# Patient Record
Sex: Male | Born: 1965 | Race: White | Hispanic: No | Marital: Single | Smoking: Current every day smoker
Health system: Southern US, Community
[De-identification: ages and names within clinical notes are randomized; demographics above are authoritative.]

## PROBLEM LIST (undated history)

## (undated) DIAGNOSIS — B1920 Unspecified viral hepatitis C without hepatic coma: Secondary | ICD-10-CM

## (undated) DIAGNOSIS — E119 Type 2 diabetes mellitus without complications: Secondary | ICD-10-CM

## (undated) DIAGNOSIS — I10 Essential (primary) hypertension: Secondary | ICD-10-CM

## (undated) DIAGNOSIS — E785 Hyperlipidemia, unspecified: Secondary | ICD-10-CM

## (undated) DIAGNOSIS — J449 Chronic obstructive pulmonary disease, unspecified: Secondary | ICD-10-CM

## (undated) HISTORY — DX: Hyperlipidemia, unspecified: E78.5

## (undated) HISTORY — PX: INGUINAL HERNIA REPAIR: SHX194

## (undated) HISTORY — DX: Unspecified viral hepatitis C without hepatic coma: B19.20

## (undated) HISTORY — DX: Chronic obstructive pulmonary disease, unspecified: J44.9

## (undated) HISTORY — DX: Essential (primary) hypertension: I10

## (undated) HISTORY — DX: Type 2 diabetes mellitus without complications: E11.9

---

## 2005-08-19 HISTORY — PX: OTHER SURGICAL HISTORY: SHX169

## 2009-07-18 LAB — HM DIABETES EYE EXAM: HM Diabetic Eye Exam: NORMAL

## 2009-07-20 ENCOUNTER — Encounter: Payer: Self-pay | Admitting: Internal Medicine

## 2009-07-20 ENCOUNTER — Ambulatory Visit: Payer: Self-pay | Admitting: Internal Medicine

## 2009-07-20 DIAGNOSIS — J4489 Other specified chronic obstructive pulmonary disease: Secondary | ICD-10-CM | POA: Insufficient documentation

## 2009-07-20 DIAGNOSIS — E785 Hyperlipidemia, unspecified: Secondary | ICD-10-CM | POA: Insufficient documentation

## 2009-07-20 DIAGNOSIS — E1165 Type 2 diabetes mellitus with hyperglycemia: Secondary | ICD-10-CM

## 2009-07-20 DIAGNOSIS — E1129 Type 2 diabetes mellitus with other diabetic kidney complication: Secondary | ICD-10-CM

## 2009-07-20 DIAGNOSIS — I10 Essential (primary) hypertension: Secondary | ICD-10-CM | POA: Insufficient documentation

## 2009-07-20 DIAGNOSIS — IMO0002 Reserved for concepts with insufficient information to code with codable children: Secondary | ICD-10-CM | POA: Insufficient documentation

## 2009-07-20 DIAGNOSIS — J449 Chronic obstructive pulmonary disease, unspecified: Secondary | ICD-10-CM

## 2009-07-20 DIAGNOSIS — G894 Chronic pain syndrome: Secondary | ICD-10-CM

## 2009-07-20 LAB — CONVERTED CEMR LAB
AST: 35 units/L (ref 0–37)
Albumin: 3.4 g/dL — ABNORMAL LOW (ref 3.5–5.2)
BUN: 23 mg/dL (ref 6–23)
Basophils Absolute: 0 10*3/uL (ref 0.0–0.1)
Bilirubin Urine: NEGATIVE
Bilirubin, Direct: 0.1 mg/dL (ref 0.0–0.3)
Calcium: 10 mg/dL (ref 8.4–10.5)
Chloride: 97 meq/L (ref 96–112)
Eosinophils Absolute: 0.1 10*3/uL (ref 0.0–0.7)
HCT: 46 % (ref 39.0–52.0)
Hgb A1c MFr Bld: 10.6 % — ABNORMAL HIGH (ref 4.6–6.5)
Ketones, ur: NEGATIVE mg/dL
Lymphocytes Relative: 27.9 % (ref 12.0–46.0)
Lymphs Abs: 1.9 10*3/uL (ref 0.7–4.0)
MCV: 94.8 fL (ref 78.0–100.0)
Monocytes Absolute: 0.8 10*3/uL (ref 0.1–1.0)
Neutro Abs: 4 10*3/uL (ref 1.4–7.7)
Nitrite: POSITIVE
Potassium: 4.7 meq/L (ref 3.5–5.1)
RBC: 4.85 M/uL (ref 4.22–5.81)
RDW: 12.5 % (ref 11.5–14.6)
Total Bilirubin: 0.9 mg/dL (ref 0.3–1.2)
Urine Glucose: >=1000 mg/dL
Urobilinogen, UA: 0.2 (ref 0.0–1.0)
pH: 5.5 (ref 5.0–8.0)

## 2009-08-02 ENCOUNTER — Telehealth: Payer: Self-pay | Admitting: Internal Medicine

## 2009-08-10 ENCOUNTER — Ambulatory Visit: Payer: Self-pay | Admitting: Internal Medicine

## 2009-08-10 DIAGNOSIS — F17200 Nicotine dependence, unspecified, uncomplicated: Secondary | ICD-10-CM

## 2009-08-28 ENCOUNTER — Telehealth: Payer: Self-pay | Admitting: Internal Medicine

## 2009-09-12 ENCOUNTER — Telehealth: Payer: Self-pay | Admitting: Internal Medicine

## 2009-10-10 ENCOUNTER — Telehealth: Payer: Self-pay | Admitting: Internal Medicine

## 2009-10-11 ENCOUNTER — Ambulatory Visit: Payer: Self-pay | Admitting: Internal Medicine

## 2009-10-11 LAB — CONVERTED CEMR LAB
ALT: 53 units/L (ref 0–53)
Alkaline Phosphatase: 81 units/L (ref 39–117)
BUN: 29 mg/dL — ABNORMAL HIGH (ref 6–23)
CO2: 26 meq/L (ref 19–32)
Chloride: 102 meq/L (ref 96–112)
GFR calc non Af Amer: 53.57 mL/min (ref >60)
Glucose, Bld: 254 mg/dL — ABNORMAL HIGH (ref 70–99)
HDL goal, serum: 40 mg/dL
Hgb A1c MFr Bld: 8.2 % — ABNORMAL HIGH (ref 4.6–6.5)
Ketones, ur: NEGATIVE mg/dL
Leukocytes, UA: NEGATIVE
Nitrite: NEGATIVE
Potassium: 4.6 meq/L (ref 3.5–5.1)
Sodium: 136 meq/L (ref 135–145)
Specific Gravity, Urine: >=1.03 (ref 1.000–1.030)
Total Bilirubin: 0.5 mg/dL (ref 0.3–1.2)
Total CK: 42 units/L (ref 7–232)
pH: 5.5 (ref 5.0–8.0)

## 2009-10-12 ENCOUNTER — Encounter: Payer: Self-pay | Admitting: Internal Medicine

## 2009-11-10 ENCOUNTER — Telehealth: Payer: Self-pay | Admitting: Internal Medicine

## 2009-11-20 ENCOUNTER — Telehealth: Payer: Self-pay | Admitting: Internal Medicine

## 2009-11-24 ENCOUNTER — Telehealth: Payer: Self-pay | Admitting: Internal Medicine

## 2009-11-27 ENCOUNTER — Telehealth: Payer: Self-pay | Admitting: Internal Medicine

## 2009-12-11 ENCOUNTER — Ambulatory Visit: Payer: Self-pay | Admitting: Internal Medicine

## 2009-12-11 LAB — CONVERTED CEMR LAB
BUN: 21 mg/dL (ref 6–23)
Basophils Absolute: 0 10*3/uL (ref 0.0–0.1)
Basophils Relative: 0.3 % (ref 0.0–3.0)
Chloride: 101 meq/L (ref 96–112)
Creatinine, Ser: 1.3 mg/dL (ref 0.4–1.5)
Eosinophils Absolute: 0 10*3/uL (ref 0.0–0.7)
Eosinophils Relative: 0.5 % (ref 0.0–5.0)
Hemoglobin: 13.9 g/dL (ref 13.0–17.0)
MCHC: 35.5 g/dL (ref 30.0–36.0)
MCV: 93.5 fL (ref 78.0–100.0)
Monocytes Absolute: 1 10*3/uL (ref 0.1–1.0)
Monocytes Relative: 13.1 % — ABNORMAL HIGH (ref 3.0–12.0)
Neutro Abs: 4.2 10*3/uL (ref 1.4–7.7)
Nitrite: POSITIVE
RBC: 4.18 M/uL — ABNORMAL LOW (ref 4.22–5.81)
Urobilinogen, UA: 1 (ref 0.0–1.0)
WBC: 7.3 10*3/uL (ref 4.5–10.5)
pH: 6 (ref 5.0–8.0)

## 2009-12-26 ENCOUNTER — Ambulatory Visit: Payer: Self-pay | Admitting: Internal Medicine

## 2010-01-08 ENCOUNTER — Telehealth: Payer: Self-pay | Admitting: Internal Medicine

## 2010-02-14 ENCOUNTER — Telehealth: Payer: Self-pay | Admitting: Internal Medicine

## 2010-03-16 ENCOUNTER — Ambulatory Visit: Payer: Self-pay | Admitting: Internal Medicine

## 2010-03-16 LAB — CONVERTED CEMR LAB
Alkaline Phosphatase: 92 units/L (ref 39–117)
Basophils Relative: 0.6 % (ref 0.0–3.0)
Calcium: 10 mg/dL (ref 8.4–10.5)
Eosinophils Absolute: 0.1 10*3/uL (ref 0.0–0.7)
Eosinophils Relative: 1.5 % (ref 0.0–5.0)
GFR calc non Af Amer: 57.43 mL/min (ref >60)
Hemoglobin: 15.6 g/dL (ref 13.0–17.0)
Lymphs Abs: 2.8 10*3/uL (ref 0.7–4.0)
Monocytes Absolute: 0.8 10*3/uL (ref 0.1–1.0)
Neutro Abs: 4.6 10*3/uL (ref 1.4–7.7)
Neutrophils Relative %: 54.9 % (ref 43.0–77.0)
Nitrite: NEGATIVE
Platelets: 232 10*3/uL (ref 150.0–400.0)
RBC: 4.72 M/uL (ref 4.22–5.81)
RDW: 13.5 % (ref 11.5–14.6)
Sodium: 134 meq/L — ABNORMAL LOW (ref 135–145)
Specific Gravity, Urine: >=1.03 (ref 1.000–1.030)
Total Bilirubin: 0.6 mg/dL (ref 0.3–1.2)
Total Protein: 6.9 g/dL (ref 6.0–8.3)
Urine Glucose: >=1000 mg/dL
WBC: 8.4 10*3/uL (ref 4.5–10.5)
pH: 5 (ref 5.0–8.0)

## 2010-03-17 ENCOUNTER — Encounter: Payer: Self-pay | Admitting: Internal Medicine

## 2010-03-20 ENCOUNTER — Telehealth: Payer: Self-pay | Admitting: Internal Medicine

## 2010-03-30 ENCOUNTER — Telehealth: Payer: Self-pay | Admitting: Internal Medicine

## 2010-04-06 ENCOUNTER — Telehealth: Payer: Self-pay | Admitting: Internal Medicine

## 2010-04-09 ENCOUNTER — Ambulatory Visit: Payer: Self-pay | Admitting: Internal Medicine

## 2010-04-09 DIAGNOSIS — M542 Cervicalgia: Secondary | ICD-10-CM

## 2010-04-19 ENCOUNTER — Telehealth: Payer: Self-pay | Admitting: Internal Medicine

## 2010-04-24 ENCOUNTER — Telehealth: Payer: Self-pay | Admitting: Internal Medicine

## 2010-04-27 ENCOUNTER — Telehealth: Payer: Self-pay | Admitting: Internal Medicine

## 2010-08-01 ENCOUNTER — Telehealth: Payer: Self-pay | Admitting: Internal Medicine

## 2010-08-06 ENCOUNTER — Telehealth: Payer: Self-pay | Admitting: Internal Medicine

## 2010-08-08 ENCOUNTER — Ambulatory Visit: Payer: Self-pay | Admitting: Internal Medicine

## 2010-08-08 ENCOUNTER — Encounter: Payer: Self-pay | Admitting: Internal Medicine

## 2010-08-08 DIAGNOSIS — R740 Nonspecific elevation of levels of transaminase and lactic acid dehydrogenase [LDH]: Secondary | ICD-10-CM

## 2010-08-08 LAB — CONVERTED CEMR LAB
ALT: 51 units/L (ref 0–53)
BUN: 28 mg/dL — ABNORMAL HIGH (ref 6–23)
Bilirubin Urine: NEGATIVE
CO2: 23 meq/L (ref 19–32)
Chloride: 101 meq/L (ref 96–112)
Creatinine, Ser: 1.5 mg/dL (ref 0.4–1.5)
Eosinophils Absolute: 0.2 10*3/uL (ref 0.0–0.7)
Eosinophils Relative: 1.8 % (ref 0.0–5.0)
Glucose, Bld: 207 mg/dL — ABNORMAL HIGH (ref 70–99)
HCT: 43.4 % (ref 39.0–52.0)
Hep A IgM: NEGATIVE
Hgb A1c MFr Bld: 8.1 % — ABNORMAL HIGH (ref 4.6–6.5)
Leukocytes, UA: NEGATIVE
Lymphs Abs: 2.1 10*3/uL (ref 0.7–4.0)
MCHC: 35.3 g/dL (ref 30.0–36.0)
MCV: 92.8 fL (ref 78.0–100.0)
Monocytes Absolute: 0.9 10*3/uL (ref 0.1–1.0)
Neutrophils Relative %: 67.8 % (ref 43.0–77.0)
Nitrite: NEGATIVE
Platelets: 183 10*3/uL (ref 150.0–400.0)
Potassium: 4.6 meq/L (ref 3.5–5.1)
Specific Gravity, Urine: >=1.03 (ref 1.000–1.030)
TSH: 1.77 microintl units/mL (ref 0.35–5.50)
Total Bilirubin: 0.9 mg/dL (ref 0.3–1.2)
Total Protein: 6.7 g/dL (ref 6.0–8.3)
Urobilinogen, UA: 1 (ref 0.0–1.0)
WBC: 9.8 10*3/uL (ref 4.5–10.5)

## 2010-08-09 ENCOUNTER — Encounter: Payer: Self-pay | Admitting: Internal Medicine

## 2010-09-18 NOTE — Progress Notes (Signed)
Summary: REQ A CALL   Phone Note Call from Patient Call back at Home Phone [phone] Call back at [phone]    Summary of Call: Patient is requesting to have pain medication "adjusted". He is req a call back.  Initial call taken by: Lamar Sprinkles, CMA,  April 24, 2010 3:07 PM  Follow-up for Phone Call        First week of new patch worked x 4 day, second patches worked x 3 days, the other patches have not helped with pain at all. Please advise.  Follow-up by: Lamar Sprinkles, CMA,  April 25, 2010 9:33 AM  Additional Follow-up for Phone Call Additional follow up Details #1::        what pharmacy did he use? Additional Follow-up by: Etta Grandchild MD,  April 25, 2010 4:47 PM    Additional Follow-up for Phone Call Additional follow up Details #2::    Confirmed w/pharmacy that he filled rx on 8/22 at [facility] in [place]..............Marland KitchenLamar Sprinkles, CMA  April 25, 2010 4:54 PM  Follow-up by: Etta Grandchild MD,  April 25, 2010 4:56 PM  Additional Follow-up for Phone Call Additional follow up Details #3:: Details for Additional Follow-up Action Taken: I guess we'll have to go back to hydrocodone-please call in the Rx Additional Follow-up by: Etta Grandchild MD,  April 26, 2010 9:33 AM  New/Updated Medications: HYDROCODONE-ACETAMINOPHEN 10-500 MG TABS (HYDROCODONE-ACETAMINOPHEN) One by mouth QID as needed for pain Prescriptions: HYDROCODONE-ACETAMINOPHEN 10-500 MG TABS (HYDROCODONE-ACETAMINOPHEN) One by mouth QID as needed for pain  #120 x 2   Entered and Authorized by:   Etta Grandchild MD   Signed by:   Etta Grandchild MD on 04/26/2010   Method used:   Historical   RxID:   [id number]

## 2010-09-18 NOTE — Progress Notes (Signed)
Summary: RF  Phone Note Call from Patient Call back at Home Phone [phone] Call back at [phone]   Caller: Patient Summary of Call: Patient is requesting a call back. He is req refills for hydrocodone.  Initial call taken by: Lamar Sprinkles, CMA,  April 06, 2010 2:15 PM  Follow-up for Phone Call        no, he's not getting any early refills, he does not need to call back about refills until October, thanks Follow-up by: Etta Grandchild MD,  April 06, 2010 3:01 PM  Additional Follow-up for Phone Call Additional follow up Details #1::        LMOVM for pt to call back.Marland KitchenMarland KitchenMarland KitchenAlvy Beal Archie CMA  April 06, 2010 3:59 PM      Additional Follow-up for Phone Call Additional follow up Details #2::    Pt advised that original refill 08/07/2009 had 5 refills and Rx written 04/11 was not due to fill until May. Pt isagreed with this and although I advised pt to make f/u appt to discuss with MD he declined and became irate. Pt was then transferred to Vicie Mutters who advised pt of same and offered appt. Pt accepted and is scheduled with TLJ today @ 1p. Follow-up by: Margaret Pyle, CMA,  April 09, 2010 10:29 AM

## 2010-09-18 NOTE — Progress Notes (Signed)
Summary: ALT PAIN MEDICATION  Phone Note From Other Clinic   Summary of Call: See previous phone note. Pt informed, Called in to pharmacy w/clear instructions that 120 is to last 30 days. Initial call taken by: Lamar Sprinkles, CMA,  April 27, 2010 2:54 PM    Prescriptions: HYDROCODONE-ACETAMINOPHEN 10-500 MG TABS (HYDROCODONE-ACETAMINOPHEN) One by mouth QID as needed for pain  #120 x 2   Entered by:   Lamar Sprinkles, CMA   Authorized by:   Etta Grandchild MD   Signed by:   Lamar Sprinkles, CMA on 04/27/2010   Method used:   Telephoned to ...       [facility]* (retail)       [address] [place], [place]  [address]       Ph: [phone]       Fax: [phone]   RxID:   [id number]

## 2010-09-18 NOTE — Progress Notes (Signed)
Summary: Yetta Barre pt - please advise  Phone Note Refill Request   Refills Requested: Medication #1:  VICODIN HP 10-660 MG TABS One by mouth QID as needed for pain Initial call taken by: Lamar Sprinkles, CMA,  March 30, 2010 4:55 PM  Follow-up for Phone Call        Last Rx #100 with 5 rfills. Spoke with pharmacist - patient is consistently coming in early. He is using up #100 in 14 to 21 days. Decline to refill prescription. Will need to be addressed by Dr. Yetta Barre.  Follow-up by: Jacques Navy MD,  March 30, 2010 5:22 PM  Additional Follow-up for Phone Call Additional follow up Details #1::        I have told him before no early refills, so no Additional Follow-up by: Etta Grandchild MD,  April 01, 2010 12:45 PM    Additional Follow-up for Phone Call Additional follow up Details #2::    Pharmacy notified via fax request..Lakisha Archie CMA  April 02, 2010 1:53 PM

## 2010-09-18 NOTE — Progress Notes (Signed)
  Phone Note From Pharmacy   Caller: [facility] [address]* Summary of Call: Per pharmacist, pt said that he sometimes needs an extra tablet daily and would like to know if MD would increase to 5 tablet daily. Please advise  Thanks Initial call taken by: Rock Nephew CMA,  November 27, 2009 10:45 AM  Follow-up for Phone Call        no Follow-up by: Etta Grandchild MD,  November 27, 2009 11:10 AM  Additional Follow-up for Phone Call Additional follow up Details #1::        Pharmacy notified Additional Follow-up by: Rock Nephew CMA,  November 30, 2009 4:09 PM

## 2010-09-18 NOTE — Progress Notes (Signed)
Summary: janumet  Phone Note Call from Patient Call back at Home Phone [phone]   Summary of Call: Patient spouse left message on triage that prescription is needed for Janumet. ?How many refills allowed? Initial call taken by: Lucious Groves,  November 10, 2009 11:35 AM  Follow-up for Phone Call        Per spouse patient already has an appt for April. She is aware prescription sent to pharmacy. Follow-up by: Lucious Groves,  November 10, 2009 11:45 AM    Prescriptions: JANUMET 50-1000 MG TABS (SITAGLIPTIN-METFORMIN HCL) One by mouth two times a day for diabetes  #60 x 3   Entered by:   Lucious Groves   Authorized by:   Etta Grandchild MD   Signed by:   Lucious Groves on 11/10/2009   Method used:   Electronically to        [facility]* (retail)       [address] [place], [place]  [address]       Ph: [phone]       Fax: [phone]   RxID:   [id number]

## 2010-09-18 NOTE — Progress Notes (Signed)
Summary: Insulin  Phone Note Call from Patient   Summary of Call: [facility] left message on triage requesting that enough insulin be called in to get patient by until appt. Appt is schedule for 03-12-10. Please advise. Initial call taken by: Lucious Groves,  February 14, 2010 1:27 PM    Prescriptions: LANTUS SOLOSTAR 100 UNIT/ML SOLN (INSULIN GLARGINE) 30 units subcutaneously once daily  #1 pen x 11   Entered and Authorized by:   Etta Grandchild MD   Signed by:   Etta Grandchild MD on 02/14/2010   Method used:   Electronically to        [facility]* (retail)       [address] [place], [place]  [address]       Ph: [phone]       Fax: [phone]   RxID:   [id number]

## 2010-09-18 NOTE — Progress Notes (Signed)
Summary: Doxyccylkine request  Phone Note Refill Request Message from:  Fax from Pharmacy on September 12, 2009 9:57 AM  Refills Requested: Medication #1:  DOXYCYCLINE HYCLATE 100 MG TABS One by mouth two times a day for 30 days.   Dosage confirmed as above?Dosage Confirmed   Last Refilled: 08/10/2009 Initial call taken by: Lucious Groves,  September 12, 2009 9:57 AM  Follow-up for Phone Call        MD sent electronically. Follow-up by: Lucious Groves,  September 12, 2009 10:41 AM    Prescriptions: DOXYCYCLINE HYCLATE 100 MG TABS (DOXYCYCLINE HYCLATE) One by mouth two times a day for 30 days  #60 x 1   Entered and Authorized by:   Etta Grandchild MD   Signed by:   Etta Grandchild MD on 09/12/2009   Method used:   Electronically to        [facility]* (retail)       [address] [place], [place]  [address]       Ph: [phone]       Fax: [phone]   RxID:   [id number]

## 2010-09-18 NOTE — Progress Notes (Signed)
Summary: REFILL  Phone Note Refill Request   Refills Requested: Medication #1:  SPIRIVA HANDIHALER 18 MCG CAPS One puff once daily for wheezing Initial call taken by: Lamar Sprinkles, CMA,  August 28, 2009 10:24 AM    Prescriptions: SPIRIVA HANDIHALER 18 MCG CAPS (TIOTROPIUM BROMIDE MONOHYDRATE) One puff once daily for wheezing  #30 x 11   Entered and Authorized by:   Etta Grandchild MD   Signed by:   Etta Grandchild MD on 08/28/2009   Method used:   Electronically to        [facility]* (retail)       [address] [place], [place]  [address]       Ph: [phone]       Fax: [phone]   RxID:   [id number]

## 2010-09-18 NOTE — Assessment & Plan Note (Signed)
Summary: 2 MTH FU  STC  RS'D PER PT /NWS   Vital Signs:  Patient profile:   45 year old male Height:      69 inches Weight:      203 pounds BMI:     30.09 O2 Sat:      98 % on Room air Temp:     97.3 degrees F oral Pulse rate:   100 / minute Pulse rhythm:   regular Resp:     16 per minute BP sitting:   104 / 74  (left arm) Cuff size:   large  Vitals Entered By: Rock Nephew CMA (October 11, 2009 10:23 AM)  Nutrition Counseling: Patient's BMI is greater than 25 and therefore counseled on weight management options.  O2 Flow:  Room air CC: 2 month follow up, Hypertension Management, Lipid Management   Primary Care Provider:  Etta Grandchild MD  CC:  2 month follow up, Hypertension Management, and Lipid Management.  History of Present Illness: He returns for f/up and complains of right shoulder pain off and on for several years with no recent trauma or injury. He has persistent, unchanged back pain.  Hypertension History:      He denies headache, chest pain, palpitations, dyspnea with exertion, orthopnea, peripheral edema, visual symptoms, neurologic problems, syncope, and side effects from treatment.  He notes no problems with any antihypertensive medication side effects.        Positive major cardiovascular risk factors include male age 70 years old or older, diabetes, hyperlipidemia, hypertension, and current tobacco user.  Negative major cardiovascular risk factors include negative family history for ischemic heart disease.        Further assessment for target organ damage reveals no history of ASHD, cardiac end-organ damage (CHF/LVH), stroke/TIA, peripheral vascular disease, renal insufficiency, or hypertensive retinopathy.    Lipid Management History:      Positive NCEP/ATP III risk factors include male age 36 years old or older, diabetes, current tobacco user, and hypertension.  Negative NCEP/ATP III risk factors include no family history for ischemic heart disease, no  ASHD (atherosclerotic heart disease), no prior stroke/TIA, and no peripheral vascular disease.        The patient states that he knows about the "Therapeutic Lifestyle Change" diet.  His compliance with the TLC diet is poor.  The patient expresses understanding of adjunctive measures for cholesterol lowering.  Adjunctive measures started by the patient include limit alcohol consumpton and weight reduction.  He expresses no side effects from his lipid-lowering medication.  The patient notes symptoms to suggest myopathy or liver disease.  Comments include: aches all over.      Preventive Screening-Counseling & Management  Alcohol-Tobacco     Alcohol drinks/day: <1     Alcohol type: beer     >5/day in last 3 mos: no     Alcohol Counseling: not indicated; use of alcohol is not excessive or problematic     Feels need to cut down: no     Feels annoyed by complaints: no     Feels guilty re: drinking: no     Needs 'eye opener' in am: no     Smoking Status: current     Smoking Cessation Counseling: yes     Smoke Cessation Stage: contemplative     Packs/Day: 1.0     Year Started: 1977     Pack years: 30  Hep-HIV-STD-Contraception     Hepatitis Risk: risk noted     Hepatitis Risk Counseling:  to avoid increased hepatitis risk     HIV Risk: risk noted     HIV Risk Counseling: not indicated-no HIV risk noted     STD Risk: no risk noted      Sexual History:  currently monogamous.        Drug Use:  no.        Blood Transfusions:  no.    Clinical Review Panels:  Immunizations   Last Tetanus Booster:  Historical (08/19/2005)   Last Flu Vaccine:  Fluvax 3+ (07/20/2009)   Last Pneumovax:  Historical (08/19/2005)  Diabetes Management   HgBA1C:  10.6 (07/20/2009)   Creatinine:  1.3 (07/20/2009)   Last Dilated Eye Exam:  normal (07/18/2009)   Last Foot Exam:  yes (08/10/2009)   Last Flu Vaccine:  Fluvax 3+ (07/20/2009)   Last Pneumovax:  Historical (08/19/2005)  CBC   WBC:  6.8  (07/20/2009)   RBC:  4.85 (07/20/2009)   Hgb:  16.3 (07/20/2009)   Hct:  46.0 (07/20/2009)   Platelets:  162.0 (07/20/2009)   MCV  94.8 (07/20/2009)   MCHC  35.5 (07/20/2009)   RDW  12.5 (07/20/2009)   PMN:  58.2 (07/20/2009)   Lymphs:  27.9 (07/20/2009)   Monos:  11.7 (07/20/2009)   Eosinophils:  1.5 (07/20/2009)   Basophil:  0.7 (07/20/2009)  Complete Metabolic Panel   Glucose:  390 (07/20/2009)   Sodium:  131 (07/20/2009)   Potassium:  4.7 (07/20/2009)   Chloride:  97 (07/20/2009)   CO2:  25 (07/20/2009)   BUN:  23 (07/20/2009)   Creatinine:  1.3 (07/20/2009)   Albumin:  3.4 (07/20/2009)   Total Protein:  7.6 (07/20/2009)   Calcium:  10.0 (07/20/2009)   Total Bili:  0.9 (07/20/2009)   Alk Phos:  125 (07/20/2009)   SGPT (ALT):  49 (07/20/2009)   SGOT (AST):  35 (07/20/2009)   Medications Prior to Update: 1)  Glipizide 10 Mg Tabs (Glipizide) .... 2 Once Daily 2)  Lisinopril 40 Mg Tabs (Lisinopril) .... Take 1 Tablet By Mouth Once A Day 3)  Pravastatin Sodium 40 Mg Tabs (Pravastatin Sodium) .... Take 1 Tablet By Mouth Once A Day 4)  Spiriva Handihaler 18 Mcg Caps (Tiotropium Bromide Monohydrate) .... One Puff Once Daily For Wheezing 5)  Vicodin Hp 10-660 Mg Tabs (Hydrocodone-Acetaminophen) .... One By Mouth Qid As Needed For Pain 6)  Freestyle Lite Test  Strp (Glucose Blood) .... Test Once Daily 7)  Cialis 10 Mg Tabs (Tadalafil) .... Take As Directed 8)  Janumet 50-1000 Mg Tabs (Sitagliptin-Metformin Hcl) .... One By Mouth Two Times A Day For Diabetes 9)  Chantix Starting Month Pak 0.5 Mg X 11 & 1 Mg X 42 Tabs (Varenicline Tartrate) .... Take As Directed To Help Quit Smoking 10)  Doxycycline Hyclate 100 Mg Tabs (Doxycycline Hyclate) .... One By Mouth Two Times A Day For 30 Days  Current Medications (verified): 1)  Glipizide 10 Mg Tabs (Glipizide) .... 2 Once Daily 2)  Lisinopril 40 Mg Tabs (Lisinopril) .... Take 1 Tablet By Mouth Once A Day 3)  Pravastatin Sodium 40 Mg  Tabs (Pravastatin Sodium) .... Take 1 Tablet By Mouth Once A Day 4)  Spiriva Handihaler 18 Mcg Caps (Tiotropium Bromide Monohydrate) .... One Puff Once Daily For Wheezing 5)  Vicodin Hp 10-660 Mg Tabs (Hydrocodone-Acetaminophen) .... One By Mouth Qid As Needed For Pain 6)  Freestyle Lite Test  Strp (Glucose Blood) .... Test Once Daily 7)  Cialis 10 Mg Tabs (Tadalafil) .... Take As  Directed 8)  Janumet 50-1000 Mg Tabs (Sitagliptin-Metformin Hcl) .... One By Mouth Two Times A Day For Diabetes 9)  Doxycycline Hyclate 100 Mg Tabs (Doxycycline Hyclate) .... One By Mouth Two Times A Day For 30 Days  Allergies (verified): No Known Drug Allergies  Past History:  Past Medical History: Reviewed history from 07/20/2009 and no changes required. COPD Diabetes mellitus, type II Hyperlipidemia Hypertension  Past Surgical History: Reviewed history from 07/20/2009 and no changes required. Inguinal herniorrhaphy C-spine abscess surgery 2007 at [facility]  Family History: Reviewed history from 07/20/2009 and no changes required. Family History Diabetes 1st degree relative Family History Hypertension  Social History: Reviewed history from 07/20/2009 and no changes required. Occupation: [employer] doctor Married Current Smoker Alcohol use-yes Drug use-no Regular exercise-no  Review of Systems  The patient denies anorexia, fever, weight loss, weight gain, chest pain, peripheral edema, prolonged cough, headaches, hemoptysis, abdominal pain, hematuria, and enlarged lymph nodes.   GU:  Denies discharge, dysuria, incontinence, nocturia, urinary frequency, and urinary hesitancy.  Physical Exam  General:  alert, well-developed, well-nourished, and well-hydrated.   Head:  normocephalic and atraumatic.   Mouth:  Oral mucosa and oropharynx without lesions or exudates.  Teeth in good repair. Neck:  midline posterior surgical scar Lungs:  normal respiratory effort, no intercostal retractions, no accessory  muscle use, normal breath sounds, no dullness, no fremitus, no crackles, and no wheezes.   Heart:  normal rate, regular rhythm, no murmur, no gallop, no rub, and no JVD.   Abdomen:  soft, non-tender, normal bowel sounds, no distention, no masses, no guarding, no rigidity, no rebound tenderness, no abdominal hernia, no inguinal hernia, no hepatomegaly, and no splenomegaly.   Msk:  right shoulder has slightly decreased ROM in all directions with difficulty on the rotator cuff moves. there is not point ttp, swelling, or crepitance Pulses:  R and L carotid,radial,femoral,dorsalis pedis and posterior tibial pulses are full and equal bilaterally Extremities:  No clubbing, cyanosis, edema, or deformity noted with normal full range of motion of all joints.   Neurologic:  No cranial nerve deficits noted. Station and gait are normal. Plantar reflexes are down-going bilaterally. DTRs are symmetrical throughout. Sensory, motor and coordinative functions appear intact. Skin:  Intact without suspicious lesions or rashes Cervical Nodes:  no anterior cervical adenopathy and no posterior cervical adenopathy.   Psych:  Cognition and judgment appear intact. Alert and cooperative with normal attention span and concentration. No apparent delusions, illusions, hallucinations   Impression & Recommendations:  Problem # 1:  SHOULDER PAIN, RIGHT (ICD-719.41) Assessment New  His updated medication list for this problem includes:    Vicodin Hp 10-660 Mg Tabs (Hydrocodone-acetaminophen) ..... One by mouth qid as needed for pain  Orders: T-Shoulder Right (73030TC)  Problem # 2:  TOBACCO USE (ICD-305.1) Assessment: Deteriorated  The following medications were removed from the medication list:    Chantix Starting Month Pak 0.5 Mg X 11 & 1 Mg X 42 Tabs (Varenicline tartrate) .Marland Kitchen... Take as directed to help quit smoking  Encouraged smoking cessation and discussed different methods for smoking cessation.   Problem # 3:   HYPERTENSION (ICD-401.9) Assessment: Improved  His updated medication list for this problem includes:    Lisinopril 40 Mg Tabs (Lisinopril) .Marland Kitchen... Take 1 tablet by mouth once a day  Orders: Venipuncture (54098) TLB-BMP (Basic Metabolic Panel-BMET) (80048-METABOL) TLB-Hepatic/Liver Function Pnl (80076-HEPATIC) TLB-CK Total Only(Creatine Kinase/CPK) (82550-CK) TLB-A1C / Hgb A1C (Glycohemoglobin) (83036-A1C) TLB-Udip w/ Micro (81001-URINE)  BP today: 104/74 Prior BP:  130/80 (08/10/2009)  10 Yr Risk Heart Disease: Not enough information  Labs Reviewed: K+: 4.7 (07/20/2009) Creat: : 1.3 (07/20/2009)     Problem # 4:  UTI (ICD-599.0) Assessment: Improved  His updated medication list for this problem includes:    Doxycycline Hyclate 100 Mg Tabs (Doxycycline hyclate) ..... One by mouth two times a day for 30 days  Orders: Venipuncture ([address]) TLB-BMP (Basic Metabolic Panel-BMET) (80048-METABOL) TLB-Hepatic/Liver Function Pnl (80076-HEPATIC) TLB-CK Total Only(Creatine Kinase/CPK) (82550-CK) TLB-A1C / Hgb A1C (Glycohemoglobin) (83036-A1C) TLB-Udip w/ Micro (81001-URINE)  Problem # 5:  CHRONIC PAIN SYNDROME (ICD-338.4) Assessment: Unchanged  Problem # 6:  DIABETES MELLITUS, TYPE II (ICD-250.00) Assessment: Deteriorated  His updated medication list for this problem includes:    Glipizide 10 Mg Tabs (Glipizide) .Marland Kitchen... 2 once daily    Lisinopril 40 Mg Tabs (Lisinopril) .Marland Kitchen... Take 1 tablet by mouth once a day    Janumet 50-1000 Mg Tabs (Sitagliptin-metformin hcl) ..... One by mouth two times a day for diabetes  Orders: Venipuncture ([address]) TLB-BMP (Basic Metabolic Panel-BMET) (80048-METABOL) TLB-Hepatic/Liver Function Pnl (80076-HEPATIC) TLB-CK Total Only(Creatine Kinase/CPK) (82550-CK) TLB-A1C / Hgb A1C (Glycohemoglobin) (83036-A1C) TLB-Udip w/ Micro (81001-URINE)  Labs Reviewed: Creat: 1.3 (07/20/2009)     Last Eye Exam: normal (07/18/2009) Reviewed HgBA1c results: 10.6  (07/20/2009)  Complete Medication List: 1)  Glipizide 10 Mg Tabs (Glipizide) .... 2 once daily 2)  Lisinopril 40 Mg Tabs (Lisinopril) .... Take 1 tablet by mouth once a day 3)  Pravastatin Sodium 40 Mg Tabs (Pravastatin sodium) .... Take 1 tablet by mouth once a day 4)  Spiriva Handihaler 18 Mcg Caps (Tiotropium bromide monohydrate) .... One puff once daily for wheezing 5)  Vicodin Hp 10-660 Mg Tabs (Hydrocodone-acetaminophen) .... One by mouth qid as needed for pain 6)  Freestyle Lite Test Strp (Glucose blood) .... Test once daily 7)  Cialis 10 Mg Tabs (Tadalafil) .... Take as directed 8)  Janumet 50-1000 Mg Tabs (Sitagliptin-metformin hcl) .... One by mouth two times a day for diabetes 9)  Doxycycline Hyclate 100 Mg Tabs (Doxycycline hyclate) .... One by mouth two times a day for 30 days  Hypertension Assessment/Plan:      The patient's hypertensive risk group is category C: Target organ damage and/or diabetes.  Today's blood pressure is 104/74.  His blood pressure goal is < 130/80.  Lipid Assessment/Plan:      Based on NCEP/ATP III, the patient's risk factor category is "history of diabetes".  The patient's lipid goals are as follows: Total cholesterol goal is 200; LDL cholesterol goal is 100; HDL cholesterol goal is 40; Triglyceride goal is 150.    Patient Instructions: 1)  Please schedule a follow-up appointment in 3 months. 2)  Tobacco is very bad for your health and your loved ones! You Should stop smoking!. 3)  Stop Smoking Tips: Choose a Quit date. Cut down before the Quit date. decide what you will do as a substitute when you feel the urge to smoke(gum,toothpick,exercise). 4)  It is important that you exercise regularly at least 20 minutes 5 times a week. If you develop chest pain, have severe difficulty breathing, or feel very tired , stop exercising immediately and seek medical attention. 5)  Check your blood sugars regularly. If your readings are usually above 200 or below 70 you  should contact our office. 6)  It is important that your Diabetic A1c level is checked every 3 months. 7)  See your eye doctor yearly to check for diabetic eye damage. 8)  Check your feet each night for sore areas, calluses or signs of infection. 9)  Check your Blood Pressure regularly. If it is above 130/80: you should make an appointment.

## 2010-09-18 NOTE — Progress Notes (Signed)
Summary: MEDCIATION HELP  Phone Note Call from Patient   Summary of Call: Patient was in the office and has no insurance. Looked over his medications and sent him a letter w/suggestions how to get current rx's cheaper. Printed and completed our part of two medications, Lantus & Janumet, to attempt patient assistance.   One medication is lisinopril. Can this be changed to 20mg  2 once daily? This will only cost $20 for a 3 mth supply at any [facility]. Ok for this?  Initial call taken by: Lamar Sprinkles, CMA,  March 20, 2010 5:23 PM  Follow-up for Phone Call        yes Follow-up by: Etta Grandchild MD,  March 21, 2010 7:01 AM  Additional Follow-up for Phone Call Additional follow up Details #1::        Pt informed, he would like info mailed to him to compare prices. I have mailed letter w/info and patient assistance forms to patient for him to complete and send in on his own.  Additional Follow-up by: Lamar Sprinkles, CMA,  March 28, 2010 10:48 AM

## 2010-09-18 NOTE — Progress Notes (Signed)
Summary: MED CHANGE  Phone Note Call from Patient Call back at Home Phone [phone]   Summary of Call: Patient is requesting a call back regarding medication change.  Initial call taken by: Lamar Sprinkles, CMA,  Jan 08, 2010 2:01 PM  Follow-up for Phone Call        Patient is requesting rx for chantix. Starter pack helped patient but he did not request contiuation rx and has resume smoking. Also requesting rx for viagra instead of cialis, due to cialis not being as effective.  Follow-up by: Lamar Sprinkles, CMA,  Jan 08, 2010 2:29 PM  Additional Follow-up for Phone Call Additional follow up Details #1::        done Additional Follow-up by: Etta Grandchild MD,  Jan 08, 2010 2:33 PM    Additional Follow-up for Phone Call Additional follow up Details #2::    Left mess on pt's cell to call office back.[phone]......................Marland KitchenLamar Sprinkles, CMA  Jan 08, 2010 4:01 PM   Pt informed  Follow-up by: Lamar Sprinkles, CMA,  Jan 08, 2010 5:51 PM  New/Updated Medications: CHANTIX STARTING MONTH PAK 0.5 MG X 11 & 1 MG X 42 TABS (VARENICLINE TARTRATE) take as directed CHANTIX CONTINUING MONTH PAK 1 MG TABS (VARENICLINE TARTRATE) One by mouth two times a day VIAGRA 100 MG TABS (SILDENAFIL CITRATE) take as directed Prescriptions: VIAGRA 100 MG TABS (SILDENAFIL CITRATE) take as directed  #12 x 11   Entered and Authorized by:   Etta Grandchild MD   Signed by:   Etta Grandchild MD on 01/08/2010   Method used:   Electronically to        [facility]* (retail)       [address] [place], [place]  [address]       Ph: [phone]       Fax: [phone]   RxID:   [id number] CHANTIX CONTINUING MONTH PAK 1 MG TABS (VARENICLINE TARTRATE) One by mouth two times a day  #60 x 3   Entered and Authorized by:   Etta Grandchild MD   Signed by:   Etta Grandchild MD on 01/08/2010   Method used:   Electronically to        [facility]* (retail)       [address] [place], [place]  [address]       Ph: [phone]       Fax: [phone]   RxID:   [id number] CHANTIX STARTING MONTH PAK 0.5 MG X 11 & 1 MG X 42 TABS (VARENICLINE TARTRATE) take as directed  #1 x 0   Entered and Authorized by:   Etta Grandchild MD   Signed by:   Etta Grandchild MD on 01/08/2010   Method used:   Electronically to        [facility]* (retail)       [address] [place], [place]  [address]       Ph: [phone]       Fax: [phone]   RxID:   [id number]

## 2010-09-18 NOTE — Progress Notes (Signed)
Summary: REFILL  Phone Note Refill Request Message from:  Pharmacy  Refills Requested: Medication #1:  VICODIN HP 10-660 MG TABS One by mouth QID as needed for pain Initial call taken by: Lamar Sprinkles, CMA,  November 24, 2009 6:29 PM  Follow-up for Phone Call        yes Follow-up by: Etta Grandchild MD,  November 26, 2009 12:25 PM    Prescriptions: VICODIN HP 10-660 MG TABS (HYDROCODONE-ACETAMINOPHEN) One by mouth QID as needed for pain  #100 x 5   Entered by:   Rock Nephew CMA   Authorized by:   Etta Grandchild MD   Signed by:   Rock Nephew CMA on 11/27/2009   Method used:   Telephoned to ...       [facility]* (retail)       [address] [place], [place]  [address]       Ph: [phone]       Fax: [phone]   RxID:   [id number]

## 2010-09-18 NOTE — Assessment & Plan Note (Signed)
Summary: WANTS EARLY REFILL ON PAIN MEDS/ PER LOU /NWS   Vital Signs:  Patient profile:   46 year old male Height:      69 inches Weight:      199.25 pounds BMI:     29.53 O2 Sat:      97 % Temp:     98.3 degrees F oral Pulse rate:   80 / minute Pulse rhythm:   regular Resp:     16 per minute BP sitting:   140 / 90  (left arm) Cuff size:   large  Vitals Entered By: Rock Nephew CMA (April 09, 2010 1:20 PM)  Nutrition Counseling: Patient's BMI is greater than 25 and therefore counseled on weight management options. CC: Patient c/o continued pain and would also like to discuss med refill Is Patient Diabetic? Yes Did you bring your meter with you today? No  Does patient need assistance? Functional Status Self care Ambulation Normal   Primary Care Provider:  Etta Grandchild MD  CC:  Patient c/o continued pain and would also like to discuss med refill.  History of Present Illness: He returns today to discuss pain management for neck pain. He has been taking Vicodin up to 4-5 times a day.  He has been persistently asking for early refills. The  neck pain does not radiate into the arms. There has been no recent trauma or injury.  Preventive Screening-Counseling & Management  Alcohol-Tobacco     Alcohol drinks/day: <1     Alcohol type: beer     >5/day in last 3 mos: no     Alcohol Counseling: not indicated; use of alcohol is not excessive or problematic     Feels need to cut down: no     Feels annoyed by complaints: no     Feels guilty re: drinking: no     Needs 'eye opener' in am: no     Smoking Status: current     Smoking Cessation Counseling: yes     Smoke Cessation Stage: contemplative     Packs/Day: 1.0     Year Started: 1977     Pack years: 30     Tobacco Counseling: to quit use of tobacco products  Hep-HIV-STD-Contraception     Hepatitis Risk: risk noted     Hepatitis Risk Counseling: to avoid increased hepatitis risk     HIV Risk: risk noted     HIV Risk  Counseling: not indicated-no HIV risk noted     STD Risk: no risk noted  Medications Prior to Update: 1)  Glipizide 10 Mg Tabs (Glipizide) .... 2 Once Daily 2)  Lisinopril 40 Mg Tabs (Lisinopril) .... Take 1 Tablet By Mouth Once A Day 3)  Pravastatin Sodium 40 Mg Tabs (Pravastatin Sodium) .... Take 1 Tablet By Mouth Once A Day 4)  Spiriva Handihaler 18 Mcg Caps (Tiotropium Bromide Monohydrate) .... One Puff Once Daily For Wheezing 5)  Vicodin Hp 10-660 Mg Tabs (Hydrocodone-Acetaminophen) .... One By Mouth Qid As Needed For Pain 6)  Freestyle Lite Test  Strp (Glucose Blood) .... Test Once Daily 7)  Janumet 50-1000 Mg Tabs (Sitagliptin-Metformin Hcl) .... One By Mouth Two Times A Day For Diabetes 8)  Lantus Solostar 100 Unit/ml Soln (Insulin Glargine) .... 30 Units Subcutaneously Once Daily 9)  Viagra 100 Mg Tabs (Sildenafil Citrate) .... Take As Directed  Current Medications (verified): 1)  Glipizide 10 Mg Tabs (Glipizide) .... 2 Once Daily 2)  Lisinopril 40 Mg Tabs (Lisinopril) .... Take 1  Tablet By Mouth Once A Day 3)  Pravastatin Sodium 40 Mg Tabs (Pravastatin Sodium) .... Take 1 Tablet By Mouth Once A Day 4)  Spiriva Handihaler 18 Mcg Caps (Tiotropium Bromide Monohydrate) .... One Puff Once Daily For Wheezing 5)  Freestyle Lite Test  Strp (Glucose Blood) .... Test Once Daily 6)  Janumet 50-1000 Mg Tabs (Sitagliptin-Metformin Hcl) .... One By Mouth Two Times A Day For Diabetes 7)  Lantus Solostar 100 Unit/ml Soln (Insulin Glargine) .... 50 Units Subcutaneously Once Daily 8)  Viagra 100 Mg Tabs (Sildenafil Citrate) .... Take As Directed 9)  Butrans 10 Mcg/hr Ptwk (Buprenorphine) .... Apply One Each Week  Allergies (verified): No Known Drug Allergies  Comments:  Nurse/Medical Assistant: The patient's medications were reviewed with the patient's parent and were updated in the Medication and Allergy Lists. Alvy Beal Archie CMA (April 09, 2010 1:21 PM)  Past History:  Past Medical  History: Last updated: 07/20/2009 COPD Diabetes mellitus, type II Hyperlipidemia Hypertension  Past Surgical History: Last updated: 07/20/2009 Inguinal herniorrhaphy C-spine abscess surgery 2007 at [facility]  Family History: Last updated: 07/20/2009 Family History Diabetes 1st degree relative Family History Hypertension  Social History: Last updated: 07/20/2009 Occupation: Truck driver Married Current Smoker Alcohol use-yes Drug use-no Regular exercise-no  Risk Factors: Alcohol Use: <1 (04/09/2010) >5 drinks/d w/in last 3 months: no (04/09/2010) Exercise: no (07/20/2009)  Risk Factors: Smoking Status: current (04/09/2010) Packs/Day: 1.0 (04/09/2010)  Family History: Reviewed history from 07/20/2009 and no changes required. Family History Diabetes 1st degree relative Family History Hypertension  Social History: Reviewed history from 07/20/2009 and no changes required. Occupation: Truck Hospital doctor Married Current Smoker Alcohol use-yes Drug use-no Regular exercise-no  Review of Systems  The patient denies anorexia, fever, weight loss, chest pain, syncope, dyspnea on exertion, peripheral edema, prolonged cough, headaches, hemoptysis, abdominal pain, suspicious skin lesions, transient blindness, and enlarged lymph nodes.   General:  Denies chills, fatigue, fever, loss of appetite, malaise, sleep disorder, sweats, weakness, and weight loss. MS:  Denies joint pain, joint redness, joint swelling, loss of strength, and muscle weakness. Neuro:  Denies disturbances in coordination, falling down, headaches, numbness, poor balance, tingling, tremors, visual disturbances, and weakness.  Physical Exam  General:  alert, well-developed, well-nourished, and well-hydrated.   Head:  normocephalic, atraumatic, no abnormalities observed, and no abnormalities palpated.   Eyes:  vision grossly intact, pupils equal, pupils round, and pupils reactive to light.   Mouth:  Oral mucosa and  oropharynx without lesions or exudates.  Teeth in good repair. Neck:  midline posterior surgical scar Lungs:  normal respiratory effort, no intercostal retractions, no accessory muscle use, normal breath sounds, no dullness, no fremitus, no crackles, and no wheezes.   Heart:  normal rate, regular rhythm, no murmur, no gallop, no rub, and no JVD.   Abdomen:  soft, non-tender, normal bowel sounds, no distention, no masses, no guarding, no rigidity, no rebound tenderness, no abdominal hernia, no inguinal hernia, no hepatomegaly, and no splenomegaly.   Msk:  No deformity or scoliosis noted of thoracic or lumbar spine.   Pulses:  R and L carotid,radial,femoral,dorsalis pedis and posterior tibial pulses are full and equal bilaterally Extremities:  No clubbing, cyanosis, edema, or deformity noted with normal full range of motion of all joints.   Neurologic:  No cranial nerve deficits noted. Station and gait are normal. Plantar reflexes are down-going bilaterally. DTRs are symmetrical throughout. Sensory, motor and coordinative functions appear intact. Skin:  Intact without suspicious lesions or rashes tattoo(s).  Cervical Nodes:  no anterior cervical adenopathy and no posterior cervical adenopathy.   Axillary Nodes:  no R axillary adenopathy and no L axillary adenopathy.   Inguinal Nodes:  no R inguinal adenopathy and no L inguinal adenopathy.   Psych:  Cognition and judgment appear intact. Alert and cooperative with normal attention span and concentration. No apparent delusions, illusions, hallucinations  Diabetes Management Exam:    Foot Exam (with socks and/or shoes not present):       Sensory-Pinprick/Light touch:          Left medial foot (L-4): normal          Left dorsal foot (L-5): normal          Left lateral foot (S-1): normal          Right medial foot (L-4): normal          Right dorsal foot (L-5): normal          Right lateral foot (S-1): normal       Sensory-Monofilament:           Left foot: normal          Right foot: normal       Inspection:          Left foot: normal          Right foot: normal       Nails:          Left foot: normal          Right foot: normal   Detailed Back/Spine Exam  General:    Well-developed, well-nourished, in no acute distress; alert and oriented x 3.    Gait:    Normal heel-toe gait pattern bilaterally.    Cervical Exam:  Inspection-deformity:       scar looks well-healed and there is no ttp, erythema, or rash. Palpation-spinal tenderness:  Normal Range of Motion:    Forward Flexion:   90 degrees    Hyperextension:   90 degrees    Right Lat. Flexion:   60 degrees    Left Lat. Flexion:   60 degrees    Right Lat. Rotation:   90 degrees    Left Lat. Rotation:   90 degrees Spurling Maneuver:    negative   Impression & Recommendations:  Problem # 1:  TOBACCO USE (ICD-305.1) Assessment Unchanged  Problem # 2:  CHRONIC PAIN SYNDROME (ICD-338.4) Assessment: Unchanged  Problem # 3:  NECK PAIN, CHRONIC (ICD-723.1) Assessment: Unchanged this has been a persistent problem for him and there is no indication that he has had a recurrence of infection, recent trauma, neuro deficit or neuro complaint. I will try to cover his pain with a patch to avoid the frequent dosing of Vicodin. The following medications were removed from the medication list:    Vicodin Hp 10-660 Mg Tabs (Hydrocodone-acetaminophen) ..... One by mouth qid as needed for pain His updated medication list for this problem includes:    Butrans 10 Mcg/hr Ptwk (Buprenorphine) .Marland Kitchen... Apply one each week  Problem # 4:  DIABETES MELLITUS, TYPE II (ICD-250.00) increase lantus to 50 units qhs His updated medication list for this problem includes:    Glipizide 10 Mg Tabs (Glipizide) .Marland Kitchen... 2 once daily    Lisinopril 40 Mg Tabs (Lisinopril) .Marland Kitchen... Take 1 tablet by mouth once a day    Janumet 50-1000 Mg Tabs (Sitagliptin-metformin hcl) ..... One by mouth two times a day for  diabetes    Lantus Solostar 100  Unit/ml Soln (Insulin glargine) .Marland KitchenMarland KitchenMarland KitchenMarland Kitchen 50 units subcutaneously once daily  Labs Reviewed: Creat: 1.4 (03/16/2010)     Last Eye Exam: normal (07/18/2009) Reviewed HgBA1c results: 8.2 (03/16/2010)  7.8 (12/11/2009)  Complete Medication List: 1)  Glipizide 10 Mg Tabs (Glipizide) .... 2 once daily 2)  Lisinopril 40 Mg Tabs (Lisinopril) .... Take 1 tablet by mouth once a day 3)  Pravastatin Sodium 40 Mg Tabs (Pravastatin sodium) .... Take 1 tablet by mouth once a day 4)  Spiriva Handihaler 18 Mcg Caps (Tiotropium bromide monohydrate) .... One puff once daily for wheezing 5)  Freestyle Lite Test Strp (Glucose blood) .... Test once daily 6)  Janumet 50-1000 Mg Tabs (Sitagliptin-metformin hcl) .... One by mouth two times a day for diabetes 7)  Lantus Solostar 100 Unit/ml Soln (Insulin glargine) .... 50 units subcutaneously once daily 8)  Viagra 100 Mg Tabs (Sildenafil citrate) .... Take as directed 9)  Butrans 10 Mcg/hr Ptwk (Buprenorphine) .... Apply one each week  Patient Instructions: 1)  Please schedule a follow-up appointment in 1 month. 2)  Tobacco is very bad for your health and your loved ones! You Should stop smoking!. 3)  Stop Smoking Tips: Choose a Quit date. Cut down before the Quit date. decide what you will do as a substitute when you feel the urge to smoke(gum,toothpick,exercise). 4)  Check your blood sugars regularly. If your readings are usually above 200 or below 70 you should contact our office. 5)  It is important that your Diabetic A1c level is checked every 3 months. 6)  See your eye doctor yearly to check for diabetic eye damage. Prescriptions: LANTUS SOLOSTAR 100 UNIT/ML SOLN (INSULIN GLARGINE) 50 units subcutaneously once daily  #1 month x 11   Entered and Authorized by:   Etta Grandchild MD   Signed by:   Etta Grandchild MD on 04/09/2010   Method used:   Print then Give to Patient   RxID:   [id number] BUTRANS 10 MCG/HR PTWK  (BUPRENORPHINE) Apply one each week  #4 x 5   Entered and Authorized by:   Etta Grandchild MD   Signed by:   Etta Grandchild MD on 04/09/2010   Method used:   Print then Give to Patient   RxID:   [id number]

## 2010-09-18 NOTE — Progress Notes (Signed)
  Phone Note Call from Patient Call back at [phone]   Summary of Call: Patient is requesting a call back.  Initial call taken by: Lamar Sprinkles, CMA,  April 19, 2010 1:41 PM  Follow-up for Phone Call        left mess to call office back. Follow-up by: Lamar Sprinkles, CMA,  April 19, 2010 2:16 PM  Additional Follow-up for Phone Call Additional follow up Details #1::        Returned call to patient, left message on voicemail to call back to office. Lucious Groves CMA  April 19, 2010 3:37 PM   multiple attempts, unable to contact patient Additional Follow-up by: Lamar Sprinkles, CMA,  April 20, 2010 6:29 PM

## 2010-09-18 NOTE — Progress Notes (Signed)
Summary: RX PROBLEM  Phone Note Call from Patient Call back at Home Phone [phone]   Summary of Call: Patient is requesting that we call his pharm to "fix" a problem w/the hydrocodone.  Initial call taken by: Lamar Sprinkles, CMA,  November 20, 2009 6:35 PM  Follow-up for Phone Call        Spoke w/pharm: Pt last filled med on 3/17 #100 (25 day supply) He has no refills left. Pharm will not refill med until Friday at the earliest. left mess to call office back on pt's home #.  Follow-up by: Lamar Sprinkles, CMA,  November 21, 2009 9:51 AM  Additional Follow-up for Phone Call Additional follow up Details #1::        Multiple attempts, no return call. Rf request from pharm recieved.  Additional Follow-up by: Lamar Sprinkles, CMA,  November 24, 2009 6:28 PM

## 2010-09-18 NOTE — Letter (Signed)
Summary: Results Follow-up Letter  [facility]  [address] [place], [place] [address]   Phone: [phone]  Fax: [phone]    10/12/2009  [address] [place], [place]  [address]  Dear Alvin Figueroa,   The following are the results of your recent test(s):  Test     Result     Blood sugar     254, too high A1C=8.2     high Kidney     normal Liver       normal Urine       high sugar level    _________________________________________________________  Please call for an appointment soon to discuss better blood sugar control _________________________________________________________ _________________________________________________________ _________________________________________________________  Sincerely,  Sanda Linger MD [facility]

## 2010-09-18 NOTE — Medication Information (Signed)
Summary: [employer] Patient Insurance account manager Patient Assist Program Application   Imported By: Sherian Rein 03/30/2010 09:40:55  _____________________________________________________________________  External Attachment:    Type:   Image     Comment:   External Document

## 2010-09-18 NOTE — Letter (Signed)
Summary: Results Follow-up Letter  [facility]  [address] [place], [place] [address]   Phone: [phone]  Fax: [phone]    03/17/2010  [address] [place], [place]  [address]  Dear Mr. Date,   The following are the results of your recent test(s):  Test     Result     Blood sugar     too high Liver enzymes   elevated CBC       normal Urine       some evidence of infection   _________________________________________________________  Please call for an appointment soon _________________________________________________________ _________________________________________________________ _________________________________________________________  Sincerely,  Sanda Linger MD [facility]

## 2010-09-18 NOTE — Progress Notes (Signed)
Summary: Hydrocodone  Phone Note Call from Patient   Summary of Call: Pt called regarding refill of hydrocodone. He says pharm will not fill med.  Spoke with pharmacy: Fill dates for hydrocodone 1 qid as needed # 100 are: 12/2 12/23 1/10 and 2/01.   Is it ok for pt to take med qid every day? Rx written is only for 24 day supply. If ok pharm wants verbal ok to fill today.   Initial call taken by: Lamar Sprinkles, CMA,  October 10, 2009 12:13 PM  Follow-up for Phone Call        yes Follow-up by: Etta Grandchild MD,  October 10, 2009 7:42 PM  Additional Follow-up for Phone Call Additional follow up Details #1::        Called [facility] spoke with pharmacist who could not find info for pt.Marland KitchenMarland KitchenAlvy Beal Archie CMA  October 11, 2009 11:20 AM     Additional Follow-up for Phone Call Additional follow up Details #2::    Called pharm gave ok to fill, pt also needed rx for below meds.  Follow-up by: Lamar Sprinkles, CMA,  October 12, 2009 8:45 AM  Prescriptions: PRAVASTATIN SODIUM 40 MG TABS (PRAVASTATIN SODIUM) Take 1 tablet by mouth once a day  #90 x 1   Entered by:   Lamar Sprinkles, CMA   Authorized by:   Etta Grandchild MD   Signed by:   Lamar Sprinkles, CMA on 10/12/2009   Method used:   Electronically to        [facility]* (retail)       [address] [place], [place]  [address]       Ph: [phone]       Fax: [phone]   RxID:   [id number] LISINOPRIL 40 MG TABS (LISINOPRIL) Take 1 tablet by mouth once a day  #90 x 1   Entered by:   Lamar Sprinkles, CMA   Authorized by:   Etta Grandchild MD   Signed by:   Lamar Sprinkles, CMA on 10/12/2009   Method used:   Electronically to        [facility]* (retail)       [address] [place], [place]  [address]       Ph: [phone]       Fax: [phone]   RxID:   [id number]

## 2010-09-18 NOTE — Assessment & Plan Note (Signed)
Summary: weak,fatigue/cd   Vital Signs:  Patient profile:   45 year old male Height:      69 inches Weight:      199.50 pounds BMI:     29.57 O2 Sat:      98 % on Room air Temp:     98.0 degrees F oral Pulse rate:   110 / minute Pulse rhythm:   regular Resp:     16 per minute BP sitting:   128 / 82  (left arm) Cuff size:   large  Vitals Entered By: Rock Nephew CMA (December 11, 2009 2:57 PM)  Nutrition Counseling: Patient's BMI is greater than 25 and therefore counseled on weight management options.  O2 Flow:  Room air CC: weakness and fatigue, Diarrhea, Hypertension Management, Lipid Management Is Patient Diabetic? Yes Did you bring your meter with you today? No Pain Assessment Patient in pain? yes      CBG Result 196   Primary Care Provider:  Etta Grandchild MD  CC:  weakness and fatigue, Diarrhea, Hypertension Management, and Lipid Management.  History of Present Illness:  Follow-Up Visit      This is a 45 year old man who presents for Follow-up visit.  The patient complains of high blood sugar symptoms, but denies chest pain, palpitations, dizziness, syncope, low blood sugar symptoms, edema, and SOB.  The patient reports monitoring BP and monitoring blood sugars.  When questioned about possible medication side effects, the patient notes none.  His blood sugars have  been up to 300.  Diarrhea      The patient also presents with Diarrhea.  The symptoms began 3 days ago.  The severity is described as moderate.  The patient reports 4-6 stools per day and watery/unformed stools, but denies voluminous stools, blood in stool, mucus in stool, greasy stools, malodorous stools, fecal urgency, fecal soiling, alternating diarrhea/constipation, nocturnal diarrhea, fasting diarrhea, bloating, gassiness, and abrupt onset of symptoms.  Associated symptoms include abdominal cramps and nausea.  The patient denies fever, abdominal pain, vomiting, lightheadedness, increased thirst, weight  loss, joint pains, mouth ulcers, and eye redness.  The symptoms are worse with any food.  The symptoms are better with fasting.  Patient's risk factors for diarrhea include recent antibiotic use and sick contact.    Hypertension History:      He denies headache, chest pain, palpitations, dyspnea with exertion, orthopnea, PND, peripheral edema, visual symptoms, neurologic problems, syncope, and side effects from treatment.  He notes no problems with any antihypertensive medication side effects.        Positive major cardiovascular risk factors include male age 21 years old or older, diabetes, hyperlipidemia, hypertension, and current tobacco user.  Negative major cardiovascular risk factors include negative family history for ischemic heart disease.        Further assessment for target organ damage reveals no history of ASHD, cardiac end-organ damage (CHF/LVH), stroke/TIA, peripheral vascular disease, renal insufficiency, or hypertensive retinopathy.    Lipid Management History:      Positive NCEP/ATP III risk factors include male age 56 years old or older, diabetes, current tobacco user, and hypertension.  Negative NCEP/ATP III risk factors include no family history for ischemic heart disease, no ASHD (atherosclerotic heart disease), no prior stroke/TIA, no peripheral vascular disease, and no history of aortic aneurysm.        The patient states that he knows about the "Therapeutic Lifestyle Change" diet.  His compliance with the TLC diet is poor.  The patient  does not know about adjunctive measures for cholesterol lowering.  Adjunctive measures started by the patient include fiber, limit alcohol consumpton, and weight reduction.  He expresses no side effects from his lipid-lowering medication.  The patient denies any symptoms to suggest myopathy or liver disease.      Preventive Screening-Counseling & Management  Alcohol-Tobacco     Smoking Cessation Counseling: yes  Current Medications  (verified): 1)  Glipizide 10 Mg Tabs (Glipizide) .... 2 Once Daily 2)  Lisinopril 40 Mg Tabs (Lisinopril) .... Take 1 Tablet By Mouth Once A Day 3)  Pravastatin Sodium 40 Mg Tabs (Pravastatin Sodium) .... Take 1 Tablet By Mouth Once A Day 4)  Spiriva Handihaler 18 Mcg Caps (Tiotropium Bromide Monohydrate) .... One Puff Once Daily For Wheezing 5)  Vicodin Hp 10-660 Mg Tabs (Hydrocodone-Acetaminophen) .... One By Mouth Qid As Needed For Pain 6)  Freestyle Lite Test  Strp (Glucose Blood) .... Test Once Daily 7)  Cialis 10 Mg Tabs (Tadalafil) .... Take As Directed 8)  Janumet 50-1000 Mg Tabs (Sitagliptin-Metformin Hcl) .... One By Mouth Two Times A Day For Diabetes  Allergies (verified): No Known Drug Allergies  Past History:  Past Medical History: Reviewed history from 07/20/2009 and no changes required. COPD Diabetes mellitus, type II Hyperlipidemia Hypertension  Past Surgical History: Reviewed history from 07/20/2009 and no changes required. Inguinal herniorrhaphy C-spine abscess surgery 2007 at [facility]  Family History: Reviewed history from 07/20/2009 and no changes required. Family History Diabetes 1st degree relative Family History Hypertension  Social History: Reviewed history from 07/20/2009 and no changes required. Occupation: [employer] doctor Married Current Smoker Alcohol use-yes Drug use-no Regular exercise-no  Review of Systems       The patient complains of weight gain.  The patient denies anorexia, fever, weight loss, chest pain, peripheral edema, prolonged cough, hemoptysis, abdominal pain, hematuria, suspicious skin lesions, and enlarged lymph nodes.   GU:  Denies discharge, dysuria, hematuria, nocturia, urinary frequency, and urinary hesitancy.  Physical Exam  General:  alert, well-developed, well-nourished, and well-hydrated.   Head:  pink, moist mm., no icterus. Mouth:  Oral mucosa and oropharynx without lesions or exudates.  Teeth in good repair. Neck:   midline posterior surgical scar Lungs:  normal respiratory effort, no intercostal retractions, no accessory muscle use, normal breath sounds, no dullness, no fremitus, no crackles, and no wheezes.   Heart:  normal rate, regular rhythm, no murmur, no gallop, no rub, and no JVD.   Abdomen:  soft, non-tender, normal bowel sounds, no distention, no masses, no guarding, no rigidity, no rebound tenderness, no abdominal hernia, no inguinal hernia, no hepatomegaly, and no splenomegaly.   Msk:  No deformity or scoliosis noted of thoracic or lumbar spine.   Pulses:  R and L carotid,radial,femoral,dorsalis pedis and posterior tibial pulses are full and equal bilaterally Extremities:  No clubbing, cyanosis, edema, or deformity noted with normal full range of motion of all joints.   Neurologic:  No cranial nerve deficits noted. Station and gait are normal. Plantar reflexes are down-going bilaterally. DTRs are symmetrical throughout. Sensory, motor and coordinative functions appear intact. Skin:  Intact without suspicious lesions or rashes Cervical Nodes:  no anterior cervical adenopathy and no posterior cervical adenopathy.   Axillary Nodes:  no R axillary adenopathy and no L axillary adenopathy.   Psych:  Cognition and judgment appear intact. Alert and cooperative with normal attention span and concentration. No apparent delusions, illusions, hallucinations  Diabetes Management Exam:    Foot Exam (with socks  and/or shoes not present):       Sensory-Pinprick/Light touch:          Left medial foot (L-4): normal          Left dorsal foot (L-5): normal          Left lateral foot (S-1): normal          Right medial foot (L-4): normal          Right dorsal foot (L-5): normal          Right lateral foot (S-1): normal       Sensory-Monofilament:          Left foot: normal          Right foot: normal       Inspection:          Left foot: normal          Right foot: normal       Nails:          Left foot:  normal          Right foot: normal   Impression & Recommendations:  Problem # 1:  DIARRHEA OF PRESUMED INFECTIOUS ORIGIN (ICD-009.3) I am concerned about C. diff since he recently took broad spectrum anitbiotics Orders: Venipuncture (09811) TLB-BMP (Basic Metabolic Panel-BMET) (80048-METABOL) TLB-CBC Platelet - w/Differential (85025-CBCD) TLB-A1C / Hgb A1C (Glycohemoglobin) (83036-A1C) TLB-Udip w/ Micro (81001-URINE)  Problem # 2:  UTI (ICD-599.0) Assessment: Improved  The following medications were removed from the medication list:    Doxycycline Hyclate 100 Mg Tabs (Doxycycline hyclate) ..... One by mouth two times a day for 30 days His updated medication list for this problem includes:    Metronidazole 500 Mg Tabs (Metronidazole) ..... One by mouth three times a day for diarrhea for 10 days  Orders: Venipuncture (91478) TLB-BMP (Basic Metabolic Panel-BMET) (80048-METABOL) TLB-CBC Platelet - w/Differential (85025-CBCD) TLB-A1C / Hgb A1C (Glycohemoglobin) (83036-A1C) TLB-Udip w/ Micro (81001-URINE)  Problem # 3:  DIABETES MELLITUS, TYPE II (ICD-250.00) Assessment: Deteriorated will start insulin His updated medication list for this problem includes:    Glipizide 10 Mg Tabs (Glipizide) .Marland Kitchen... 2 once daily    Lisinopril 40 Mg Tabs (Lisinopril) .Marland Kitchen... Take 1 tablet by mouth once a day    Janumet 50-1000 Mg Tabs (Sitagliptin-metformin hcl) ..... One by mouth two times a day for diabetes    Lantus Solostar 100 Unit/ml Soln (Insulin glargine) .Marland KitchenMarland KitchenMarland KitchenMarland Kitchen 30 units subcutaneously once daily  Orders: Venipuncture (29562) TLB-BMP (Basic Metabolic Panel-BMET) (80048-METABOL) TLB-CBC Platelet - w/Differential (85025-CBCD) TLB-A1C / Hgb A1C (Glycohemoglobin) (83036-A1C) TLB-Udip w/ Micro (81001-URINE) Glucose, (CBG) (13086)  Labs Reviewed: Creat: 1.5 (10/11/2009)     Last Eye Exam: normal (07/18/2009) Reviewed HgBA1c results: 8.2 (10/11/2009)  10.6 (07/20/2009)  Problem # 4:   HYPERLIPIDEMIA (ICD-272.4) Assessment: Unchanged  His updated medication list for this problem includes:    Pravastatin Sodium 40 Mg Tabs (Pravastatin sodium) .Marland Kitchen... Take 1 tablet by mouth once a day  Labs Reviewed: SGOT: 30 (10/11/2009)   SGPT: 53 (10/11/2009)  Lipid Goals: Chol Goal: 200 (10/11/2009)   HDL Goal: 40 (10/11/2009)   LDL Goal: 100 (10/11/2009)   TG Goal: 150 (10/11/2009)  Prior 10 Yr Risk Heart Disease: Not enough information (10/11/2009)  Problem # 5:  HYPERTENSION (ICD-401.9) Assessment: Improved  His updated medication list for this problem includes:    Lisinopril 40 Mg Tabs (Lisinopril) .Marland Kitchen... Take 1 tablet by mouth once a day  Orders: Venipuncture (57846) TLB-BMP (Basic Metabolic Panel-BMET) (  80048-METABOL) TLB-CBC Platelet - w/Differential (85025-CBCD) TLB-A1C / Hgb A1C (Glycohemoglobin) (83036-A1C) TLB-Udip w/ Micro (81001-URINE)  BP today: 128/82 Prior BP: 104/74 (10/11/2009)  Prior 10 Yr Risk Heart Disease: Not enough information (10/11/2009)  Labs Reviewed: K+: 4.6 (10/11/2009) Creat: : 1.5 (10/11/2009)     Complete Medication List: 1)  Glipizide 10 Mg Tabs (Glipizide) .... 2 once daily 2)  Lisinopril 40 Mg Tabs (Lisinopril) .... Take 1 tablet by mouth once a day 3)  Pravastatin Sodium 40 Mg Tabs (Pravastatin sodium) .... Take 1 tablet by mouth once a day 4)  Spiriva Handihaler 18 Mcg Caps (Tiotropium bromide monohydrate) .... One puff once daily for wheezing 5)  Vicodin Hp 10-660 Mg Tabs (Hydrocodone-acetaminophen) .... One by mouth qid as needed for pain 6)  Freestyle Lite Test Strp (Glucose blood) .... Test once daily 7)  Cialis 10 Mg Tabs (Tadalafil) .... Take as directed 8)  Janumet 50-1000 Mg Tabs (Sitagliptin-metformin hcl) .... One by mouth two times a day for diabetes 9)  Metronidazole 500 Mg Tabs (Metronidazole) .... One by mouth three times a day for diarrhea for 10 days 10)  Lantus Solostar 100 Unit/ml Soln (Insulin glargine) .... 30  units subcutaneously once daily  Hypertension Assessment/Plan:      The patient's hypertensive risk group is category C: Target organ damage and/or diabetes.  Today's blood pressure is 128/82.  His blood pressure goal is < 130/80.  Lipid Assessment/Plan:      Based on NCEP/ATP III, the patient's risk factor category is "history of diabetes".  The patient's lipid goals are as follows: Total cholesterol goal is 200; LDL cholesterol goal is 100; HDL cholesterol goal is 40; Triglyceride goal is 150.    Patient Instructions: 1)  Please schedule a follow-up appointment in 3 months. 2)  Tobacco is very bad for your health and your loved ones! You Should stop smoking!. 3)  Stop Smoking Tips: Choose a Quit date. Cut down before the Quit date. decide what you will do as a substitute when you feel the urge to smoke(gum,toothpick,exercise). 4)  Check your blood sugars regularly. If your readings are usually above 200  or below 70 you should contact our office. 5)  It is important that your Diabetic A1c level is checked every 3 months. 6)  See your eye doctor yearly to check for diabetic eye damage. 7)  Check your feet each night for sore areas, calluses or signs of infection. 8)  Check your Blood Pressure regularly. If it is above 130/80: you should make an appointment. Prescriptions: LANTUS SOLOSTAR 100 UNIT/ML SOLN (INSULIN GLARGINE) 30 units subcutaneously once daily  #7 pens x 0   Entered and Authorized by:   Etta Grandchild MD   Signed by:   Etta Grandchild MD on 12/11/2009   Method used:   Samples Given   RxID:   [id number] METRONIDAZOLE 500 MG TABS (METRONIDAZOLE) One by mouth three times a day for diarrhea for 10 days  #30 x 0   Entered and Authorized by:   Etta Grandchild MD   Signed by:   Etta Grandchild MD on 12/11/2009   Method used:   Electronically to        [facility]* (retail)       [address] [place], [place]  [address]       Ph: [phone]       Fax:  [phone]   RxID:   [id number]  Laboratory Results   Blood Tests   Date/Time Received: Rock Nephew CMA  December 11, 2009 3:22 PM   CBG Random:: 196mg /dL

## 2010-09-18 NOTE — Assessment & Plan Note (Signed)
Summary: FU---STC   Vital Signs:  Patient profile:   45 year old male Height:      69 inches Weight:      201 pounds O2 Sat:      98 % on Room air Temp:     98.2 degrees F oral Pulse rate:   80 / minute Pulse rhythm:   regular Resp:     16 per minute BP sitting:   128 / 82  (left arm)  O2 Flow:  Room air  Primary Care Provider:  Etta Grandchild MD   History of Present Illness:  Follow-Up Visit      This is a 45 year old man who presents for Follow-up visit.  The patient complains of high blood sugar symptoms, but denies chest pain, palpitations, dizziness, syncope, low blood sugar symptoms, edema, and SOB.  Since the last visit the patient notes no new problems or concerns.  The patient reports not taking meds as prescribed, not monitoring BP, not monitoring blood sugars, and dietary noncompliance.  When questioned about possible medication side effects, the patient notes none.    Preventive Screening-Counseling & Management  Alcohol-Tobacco     Alcohol drinks/day: <1     Alcohol type: beer     >5/day in last 3 mos: no     Alcohol Counseling: not indicated; use of alcohol is not excessive or problematic     Feels need to cut down: no     Feels annoyed by complaints: no     Feels guilty re: drinking: no     Needs 'eye opener' in am: no     Smoking Status: current     Smoking Cessation Counseling: yes     Smoke Cessation Stage: contemplative     Packs/Day: 1.0     Year Started: 1977     Pack years: 30     Tobacco Counseling: to quit use of tobacco products  Hep-HIV-STD-Contraception     Hepatitis Risk: risk noted     Hepatitis Risk Counseling: to avoid increased hepatitis risk     HIV Risk: risk noted     HIV Risk Counseling: not indicated-no HIV risk noted     STD Risk: no risk noted      Sexual History:  currently monogamous.        Drug Use:  no.        Blood Transfusions:  no.    Clinical Review Panels:  Diabetes Management   HgBA1C:  7.8 (12/11/2009)  Creatinine:  1.3 (12/11/2009)   Last Dilated Eye Exam:  normal (07/18/2009)   Last Foot Exam:  yes (03/16/2010)   Last Flu Vaccine:  Fluvax 3+ (07/20/2009)   Last Pneumovax:  Historical (08/19/2005)  CBC   WBC:  7.3 (12/11/2009)   RBC:  4.18 (12/11/2009)   Hgb:  13.9 (12/11/2009)   Hct:  39.1 (12/11/2009)   Platelets:  173.0 (12/11/2009)   MCV  93.5 (12/11/2009)   MCHC  35.5 (12/11/2009)   RDW  13.1 (12/11/2009)   PMN:  58.1 (12/11/2009)   Lymphs:  28.0 (12/11/2009)   Monos:  13.1 (12/11/2009)   Eosinophils:  0.5 (12/11/2009)   Basophil:  0.3 (12/11/2009)  Complete Metabolic Panel   Glucose:  194 (12/11/2009)   Sodium:  139 (12/11/2009)   Potassium:  3.9 (12/11/2009)   Chloride:  101 (12/11/2009)   CO2:  26 (12/11/2009)   BUN:  21 (12/11/2009)   Creatinine:  1.3 (12/11/2009)   Albumin:  3.7 (10/11/2009)  Total Protein:  7.4 (10/11/2009)   Calcium:  10.0 (12/11/2009)   Total Bili:  0.5 (10/11/2009)   Alk Phos:  81 (10/11/2009)   SGPT (ALT):  53 (10/11/2009)   SGOT (AST):  30 (10/11/2009)   Medications Prior to Update: 1)  Glipizide 10 Mg Tabs (Glipizide) .... 2 Once Daily 2)  Lisinopril 40 Mg Tabs (Lisinopril) .... Take 1 Tablet By Mouth Once A Day 3)  Pravastatin Sodium 40 Mg Tabs (Pravastatin Sodium) .... Take 1 Tablet By Mouth Once A Day 4)  Spiriva Handihaler 18 Mcg Caps (Tiotropium Bromide Monohydrate) .... One Puff Once Daily For Wheezing 5)  Vicodin Hp 10-660 Mg Tabs (Hydrocodone-Acetaminophen) .... One By Mouth Qid As Needed For Pain 6)  Freestyle Lite Test  Strp (Glucose Blood) .... Test Once Daily 7)  Janumet 50-1000 Mg Tabs (Sitagliptin-Metformin Hcl) .... One By Mouth Two Times A Day For Diabetes 8)  Metronidazole 500 Mg Tabs (Metronidazole) .... One By Mouth Three Times A Day For Diarrhea For 10 Days 9)  Lantus Solostar 100 Unit/ml Soln (Insulin Glargine) .... 30 Units Subcutaneously Once Daily 10)  Chantix Starting Month Pak 0.5 Mg X 11 & 1 Mg X 42 Tabs  (Varenicline Tartrate) .... Take As Directed 11)  Chantix Continuing Month Pak 1 Mg Tabs (Varenicline Tartrate) .... One By Mouth Two Times A Day 12)  Viagra 100 Mg Tabs (Sildenafil Citrate) .... Take As Directed  Current Medications (verified): 1)  Glipizide 10 Mg Tabs (Glipizide) .... 2 Once Daily 2)  Lisinopril 40 Mg Tabs (Lisinopril) .... Take 1 Tablet By Mouth Once A Day 3)  Pravastatin Sodium 40 Mg Tabs (Pravastatin Sodium) .... Take 1 Tablet By Mouth Once A Day 4)  Spiriva Handihaler 18 Mcg Caps (Tiotropium Bromide Monohydrate) .... One Puff Once Daily For Wheezing 5)  Vicodin Hp 10-660 Mg Tabs (Hydrocodone-Acetaminophen) .... One By Mouth Qid As Needed For Pain 6)  Freestyle Lite Test  Strp (Glucose Blood) .... Test Once Daily 7)  Janumet 50-1000 Mg Tabs (Sitagliptin-Metformin Hcl) .... One By Mouth Two Times A Day For Diabetes 8)  Lantus Solostar 100 Unit/ml Soln (Insulin Glargine) .... 30 Units Subcutaneously Once Daily 9)  Viagra 100 Mg Tabs (Sildenafil Citrate) .... Take As Directed  Allergies (verified): No Known Drug Allergies  Past History:  Past Medical History: Last updated: 07/20/2009 COPD Diabetes mellitus, type II Hyperlipidemia Hypertension  Past Surgical History: Last updated: 07/20/2009 Inguinal herniorrhaphy C-spine abscess surgery 2007 at [facility]  Family History: Last updated: 07/20/2009 Family History Diabetes 1st degree relative Family History Hypertension  Social History: Last updated: 07/20/2009 Occupation: Truck driver Married Current Smoker Alcohol use-yes Drug use-no Regular exercise-no  Risk Factors: Alcohol Use: <1 (03/16/2010) >5 drinks/d w/in last 3 months: no (03/16/2010) Exercise: no (07/20/2009)  Risk Factors: Smoking Status: current (03/16/2010) Packs/Day: 1.0 (03/16/2010)  Family History: Reviewed history from 07/20/2009 and no changes required. Family History Diabetes 1st degree relative Family History  Hypertension  Social History: Reviewed history from 07/20/2009 and no changes required. Occupation: Truck Hospital doctor Married Current Smoker Alcohol use-yes Drug use-no Regular exercise-no  Review of Systems  The patient denies anorexia, fever, weight loss, weight gain, chest pain, syncope, dyspnea on exertion, peripheral edema, prolonged cough, headaches, hemoptysis, abdominal pain, hematuria, depression, and angioedema.   Endo:  Denies cold intolerance, excessive hunger, excessive thirst, excessive urination, heat intolerance, polyuria, and weight change.  Physical Exam  General:  alert, well-developed, well-nourished, and well-hydrated.   Head:  pink, moist mm., no icterus.  Mouth:  Oral mucosa and oropharynx without lesions or exudates.  Teeth in good repair. Neck:  midline posterior surgical scar Lungs:  normal respiratory effort, no intercostal retractions, no accessory muscle use, normal breath sounds, no dullness, no fremitus, no crackles, and no wheezes.   Heart:  normal rate, regular rhythm, no murmur, no gallop, no rub, and no JVD.   Abdomen:  soft, non-tender, normal bowel sounds, no distention, no masses, no guarding, no rigidity, no rebound tenderness, no abdominal hernia, no inguinal hernia, no hepatomegaly, and no splenomegaly.   Msk:  No deformity or scoliosis noted of thoracic or lumbar spine.   Pulses:  R and L carotid,radial,femoral,dorsalis pedis and posterior tibial pulses are full and equal bilaterally Extremities:  No clubbing, cyanosis, edema, or deformity noted with normal full range of motion of all joints.   Neurologic:  No cranial nerve deficits noted. Station and gait are normal. Plantar reflexes are down-going bilaterally. DTRs are symmetrical throughout. Sensory, motor and coordinative functions appear intact. Skin:  Intact without suspicious lesions or rashes Cervical Nodes:  no anterior cervical adenopathy and no posterior cervical adenopathy.   Axillary  Nodes:  no R axillary adenopathy and no L axillary adenopathy.   Inguinal Nodes:  no R inguinal adenopathy and no L inguinal adenopathy.   Psych:  Cognition and judgment appear intact. Alert and cooperative with normal attention span and concentration. No apparent delusions, illusions, hallucinations  Diabetes Management Exam:    Foot Exam (with socks and/or shoes not present):       Sensory-Pinprick/Light touch:          Left medial foot (L-4): normal          Left dorsal foot (L-5): normal          Left lateral foot (S-1): normal          Right medial foot (L-4): normal          Right dorsal foot (L-5): normal          Right lateral foot (S-1): normal       Sensory-Monofilament:          Left foot: normal          Right foot: normal       Inspection:          Left foot: normal          Right foot: normal       Nails:          Left foot: normal          Right foot: normal   Impression & Recommendations:  Problem # 1:  TOBACCO USE (ICD-305.1) Assessment Unchanged  The following medications were removed from the medication list:    Chantix Starting Month Pak 0.5 Mg X 11 & 1 Mg X 42 Tabs (Varenicline tartrate) .Marland Kitchen... Take as directed    Chantix Continuing Month Pak 1 Mg Tabs (Varenicline tartrate) ..... One by mouth two times a day  Problem # 2:  HYPERTENSION (ICD-401.9) Assessment: Improved  His updated medication list for this problem includes:    Lisinopril 40 Mg Tabs (Lisinopril) .Marland Kitchen... Take 1 tablet by mouth once a day  Orders: Venipuncture (11914) TLB-BMP (Basic Metabolic Panel-BMET) (80048-METABOL) TLB-CBC Platelet - w/Differential (85025-CBCD) TLB-Hepatic/Liver Function Pnl (80076-HEPATIC) TLB-TSH (Thyroid Stimulating Hormone) (84443-TSH) TLB-A1C / Hgb A1C (Glycohemoglobin) (83036-A1C) TLB-Udip w/ Micro (81001-URINE)  BP today: 128/82 Prior BP: 128/82 (12/11/2009)  Prior 10 Yr Risk Heart Disease: Not enough information (10/11/2009)  Labs Reviewed: K+: 3.9  (12/11/2009) Creat: : 1.3 (12/11/2009)     Problem # 3:  DIABETES MELLITUS, TYPE II (ICD-250.00) Assessment: Deteriorated  His updated medication list for this problem includes:    Glipizide 10 Mg Tabs (Glipizide) .Marland Kitchen... 2 once daily    Lisinopril 40 Mg Tabs (Lisinopril) .Marland Kitchen... Take 1 tablet by mouth once a day    Janumet 50-1000 Mg Tabs (Sitagliptin-metformin hcl) ..... One by mouth two times a day for diabetes    Lantus Solostar 100 Unit/ml Soln (Insulin glargine) .Marland KitchenMarland KitchenMarland KitchenMarland Kitchen 30 units subcutaneously once daily  Orders: Venipuncture ([address]) TLB-BMP (Basic Metabolic Panel-BMET) (80048-METABOL) TLB-CBC Platelet - w/Differential (85025-CBCD) TLB-Hepatic/Liver Function Pnl (80076-HEPATIC) TLB-TSH (Thyroid Stimulating Hormone) (84443-TSH) TLB-A1C / Hgb A1C (Glycohemoglobin) (83036-A1C) TLB-Udip w/ Micro (81001-URINE)  Labs Reviewed: Creat: 1.3 (12/11/2009)     Last Eye Exam: normal (07/18/2009) Reviewed HgBA1c results: 7.8 (12/11/2009)  8.2 (10/11/2009)  Problem # 4:  HYPERLIPIDEMIA (ICD-272.4) Assessment: Unchanged  His updated medication list for this problem includes:    Pravastatin Sodium 40 Mg Tabs (Pravastatin sodium) .Marland Kitchen... Take 1 tablet by mouth once a day  Orders: Venipuncture ([address]) TLB-BMP (Basic Metabolic Panel-BMET) (80048-METABOL) TLB-CBC Platelet - w/Differential (85025-CBCD) TLB-Hepatic/Liver Function Pnl (80076-HEPATIC) TLB-TSH (Thyroid Stimulating Hormone) (84443-TSH) TLB-A1C / Hgb A1C (Glycohemoglobin) (83036-A1C) TLB-Udip w/ Micro (81001-URINE)  Labs Reviewed: SGOT: 30 (10/11/2009)   SGPT: 53 (10/11/2009)  Lipid Goals: Chol Goal: 200 (10/11/2009)   HDL Goal: 40 (10/11/2009)   LDL Goal: 100 (10/11/2009)   TG Goal: 150 (10/11/2009)  Prior 10 Yr Risk Heart Disease: Not enough information (10/11/2009)  Complete Medication List: 1)  Glipizide 10 Mg Tabs (Glipizide) .... 2 once daily 2)  Lisinopril 40 Mg Tabs (Lisinopril) .... Take 1 tablet by mouth once a  day 3)  Pravastatin Sodium 40 Mg Tabs (Pravastatin sodium) .... Take 1 tablet by mouth once a day 4)  Spiriva Handihaler 18 Mcg Caps (Tiotropium bromide monohydrate) .... One puff once daily for wheezing 5)  Vicodin Hp 10-660 Mg Tabs (Hydrocodone-acetaminophen) .... One by mouth qid as needed for pain 6)  Freestyle Lite Test Strp (Glucose blood) .... Test once daily 7)  Janumet 50-1000 Mg Tabs (Sitagliptin-metformin hcl) .... One by mouth two times a day for diabetes 8)  Lantus Solostar 100 Unit/ml Soln (Insulin glargine) .... 30 units subcutaneously once daily 9)  Viagra 100 Mg Tabs (Sildenafil citrate) .... Take as directed  Patient Instructions: 1)  Please schedule a follow-up appointment in 4 months. 2)  Tobacco is very bad for your health and your loved ones! You Should stop smoking!. 3)  Stop Smoking Tips: Choose a Quit date. Cut down before the Quit date. decide what you will do as a substitute when you feel the urge to smoke(gum,toothpick,exercise). 4)  It is important that you exercise regularly at least 20 minutes 5 times a week. If you develop chest pain, have severe difficulty breathing, or feel very tired , stop exercising immediately and seek medical attention. 5)  You need to lose weight. Consider a lower calorie diet and regular exercise.  6)  Check your blood sugars regularly. If your readings are usually above 200 or below 70 you should contact our office. 7)  It is important that your Diabetic A1c level is checked every 3 months. 8)  See your eye doctor yearly to check for diabetic eye damage. 9)  Check your feet each night for sore areas, calluses or signs of infection. 10)  Check your Blood Pressure regularly. If it  is above 130/80: you should make an appointment. Prescriptions: JANUMET 50-1000 MG TABS (SITAGLIPTIN-METFORMIN HCL) One by mouth two times a day for diabetes  #224 x 0   Entered and Authorized by:   Etta Grandchild MD   Signed by:   Etta Grandchild MD on  03/16/2010   Method used:   Samples Given   RxID:   [id number]

## 2010-09-20 NOTE — Letter (Signed)
Summary: Results Follow-up Letter  [facility]  [address] [place], [place] [address]   Phone: [phone]  Fax: [phone]    08/08/2010  [address] [place], [place]  [address]  Dear Mr. 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

## 2010-09-20 NOTE — Progress Notes (Signed)
Summary: RF?  Phone Note Call from Patient Call back at Home Phone [phone] Call back at [phone]   Summary of Call: Patient is requesting refill of hydrocodone.  Initial call taken by: Lamar Sprinkles, CMA,  August 06, 2010 9:41 AM  Follow-up for Phone Call        no Follow-up by: Etta Grandchild MD,  August 06, 2010 9:43 AM  Additional Follow-up for Phone Call Additional follow up Details #1::        Pt is upset that MD has denied med and req to know reason why? Should pt be seen in the office for f/u? Additional Follow-up by: Lamar Sprinkles, CMA,  August 06, 2010 12:17 PM    Additional Follow-up for Phone Call Additional follow up Details #2::    he no-showed 2 weeks ago, so no Rx's from me for now Follow-up by: Etta Grandchild MD,  August 06, 2010 12:19 PM  Additional Follow-up for Phone Call Additional follow up Details #3:: Details for Additional Follow-up Action Taken: received another refill request// faxed back with denial stating that pt need appt.Alvy Beal Archie CMA  August 07, 2010 2:52 PM

## 2010-09-20 NOTE — Letter (Signed)
Summary: Results Follow-up Letter  [facility]  [address] [place], [place] [address]   Phone: [phone]  Fax: [phone]    08/09/2010  [address] [place], [place]  [address]  Dear Mr. Reddoch,   The following are the results of your recent test(s):  Test     Result     Hepatitis C test   positive   _________________________________________________________  Please call for an appointment soon for further testing _________________________________________________________ _________________________________________________________ _________________________________________________________  Sincerely,  Sanda Linger MD  [facility]

## 2010-09-20 NOTE — Progress Notes (Signed)
  Phone Note Refill Request Message from:  Pharmacy on August 01, 2010 9:21 AM  Refills Requested: Medication #1:  HYDROCODONE-ACETAMINOPHEN 10-500 MG TABS One by mouth QID as needed for pain.   Dosage confirmed as above?Dosage Confirmed   Last Refilled: 04/27/2010  Is this ok to refill?  Initial call taken by: Rock Nephew CMA,  August 01, 2010 9:21 AM  Follow-up for Phone Call        no Follow-up by: Etta Grandchild MD,  August 01, 2010 9:30 AM  Additional Follow-up for Phone Call Additional follow up Details #1::        Pharmacy notified via fax.Alvy Beal Archie CMA  August 01, 2010 1:43 PM

## 2010-09-20 NOTE — Assessment & Plan Note (Signed)
Summary: PER [facility] PT NEED APPT--MED---STC   Vital Signs:  Patient profile:   45 year old male Height:      69 inches Weight:      198 pounds BMI:     29.35 O2 Sat:      96 % on Room air Temp:     98.5 degrees F oral Pulse rate:   80 / minute Pulse rhythm:   regular Resp:     16 per minute BP sitting:   140 / 80  (left arm) Cuff size:   large  Vitals Entered By: Rock Nephew CMA (August 08, 2010 2:18 PM)  Nutrition Counseling: Patient's BMI is greater than 25 and therefore counseled on weight management options.  O2 Flow:  Room air CC: follow-up visit// pt c/o continue pain/ discuss med refill, URI symptoms  Does patient need assistance? Functional Status Self care Ambulation Normal   Primary Care Provider:  Etta Grandchild MD  CC:  follow-up visit// pt c/o continue pain/ discuss med refill and URI symptoms.  History of Present Illness:  URI Symptoms      This is a 45 year old man who presents with URI symptoms.  The symptoms began 2 days ago.  The severity is described as moderate.  The patient reports sore throat and productive cough, but denies nasal congestion, clear nasal discharge, purulent nasal discharge, dry cough, earache, and sick contacts.  The patient denies fever, stiff neck, dyspnea, wheezing, rash, vomiting, diarrhea, use of an antipyretic, and response to antipyretic.  The patient denies sneezing, seasonal symptoms, headache, muscle aches, and severe fatigue.  The patient denies the following risk factors for Strep sinusitis: unilateral facial pain, unilateral nasal discharge, poor response to decongestant, double sickening, tooth pain, Strep exposure, tender adenopathy, and absence of cough.    Preventive Screening-Counseling & Management  Alcohol-Tobacco     Alcohol drinks/day: <1     Alcohol type: beer     >5/day in last 3 mos: no     Alcohol Counseling: not indicated; use of alcohol is not excessive or problematic     Feels need to cut down: no    Feels annoyed by complaints: no     Feels guilty re: drinking: no     Needs 'eye opener' in am: no     Smoking Status: current     Smoking Cessation Counseling: yes     Smoke Cessation Stage: contemplative     Packs/Day: 1.0     Year Started: 1977     Pack years: 30     Tobacco Counseling: to quit use of tobacco products  Hep-HIV-STD-Contraception     Hepatitis Risk: risk noted     Hepatitis Risk Counseling: to avoid increased hepatitis risk     HIV Risk: risk noted     HIV Risk Counseling: not indicated-no HIV risk noted     STD Risk: no risk noted      Sexual History:  currently monogamous.        Drug Use:  no.        Blood Transfusions:  no.    Clinical Review Panels:  Immunizations   Last Tetanus Booster:  Historical (08/19/2005)   Last Flu Vaccine:  Fluvax 3+ (07/20/2009)   Last Pneumovax:  Historical (08/19/2005)  Diabetes Management   HgBA1C:  8.2 (03/16/2010)   Creatinine:  1.4 (03/16/2010)   Last Dilated Eye Exam:  normal (07/18/2009)   Last Foot Exam:  yes (08/08/2010)   Last  Flu Vaccine:  Fluvax 3+ (07/20/2009)   Last Pneumovax:  Historical (08/19/2005)  CBC   WBC:  8.4 (03/16/2010)   RBC:  4.72 (03/16/2010)   Hgb:  15.6 (03/16/2010)   Hct:  43.9 (03/16/2010)   Platelets:  232.0 (03/16/2010)   MCV  93.0 (03/16/2010)   MCHC  35.6 (03/16/2010)   RDW  13.5 (03/16/2010)   PMN:  54.9 (03/16/2010)   Lymphs:  33.9 (03/16/2010)   Monos:  9.1 (03/16/2010)   Eosinophils:  1.5 (03/16/2010)   Basophil:  0.6 (03/16/2010)  Complete Metabolic Panel   Glucose:  280 (03/16/2010)   Sodium:  134 (03/16/2010)   Potassium:  4.8 (03/16/2010)   Chloride:  100 (03/16/2010)   CO2:  27 (03/16/2010)   BUN:  23 (03/16/2010)   Creatinine:  1.4 (03/16/2010)   Albumin:  3.6 (03/16/2010)   Total Protein:  6.9 (03/16/2010)   Calcium:  10.0 (03/16/2010)   Total Bili:  0.6 (03/16/2010)   Alk Phos:  92 (03/16/2010)   SGPT (ALT):  57 (03/16/2010)   SGOT (AST):  43  (03/16/2010)   Medications Prior to Update: 1)  Glipizide 10 Mg Tabs (Glipizide) .... 2 Once Daily 2)  Lisinopril 40 Mg Tabs (Lisinopril) .... Take 1 Tablet By Mouth Once A Day 3)  Pravastatin Sodium 40 Mg Tabs (Pravastatin Sodium) .... Take 1 Tablet By Mouth Once A Day 4)  Spiriva Handihaler 18 Mcg Caps (Tiotropium Bromide Monohydrate) .... One Puff Once Daily For Wheezing 5)  Freestyle Lite Test  Strp (Glucose Blood) .... Test Once Daily 6)  Janumet 50-1000 Mg Tabs (Sitagliptin-Metformin Hcl) .... One By Mouth Two Times A Day For Diabetes 7)  Lantus Solostar 100 Unit/ml Soln (Insulin Glargine) .... 50 Units Subcutaneously Once Daily 8)  Viagra 100 Mg Tabs (Sildenafil Citrate) .... Take As Directed 9)  Hydrocodone-Acetaminophen 10-500 Mg Tabs (Hydrocodone-Acetaminophen) .... One By Mouth Qid As Needed For Pain  Current Medications (verified): 1)  Glipizide 10 Mg Tabs (Glipizide) .... 2 Once Daily 2)  Lisinopril 40 Mg Tabs (Lisinopril) .... Take 1 Tablet By Mouth Once A Day 3)  Pravastatin Sodium 40 Mg Tabs (Pravastatin Sodium) .... Take 1 Tablet By Mouth Once A Day 4)  Spiriva Handihaler 18 Mcg Caps (Tiotropium Bromide Monohydrate) .... One Puff Once Daily For Wheezing 5)  Freestyle Lite Test  Strp (Glucose Blood) .... Test Once Daily 6)  Janumet 50-1000 Mg Tabs (Sitagliptin-Metformin Hcl) .... One By Mouth Two Times A Day For Diabetes 7)  Lantus Solostar 100 Unit/ml Soln (Insulin Glargine) .... 50 Units Subcutaneously Once Daily 8)  Viagra 100 Mg Tabs (Sildenafil Citrate) .... Take As Directed 9)  Hydrocodone-Acetaminophen 10-500 Mg Tabs (Hydrocodone-Acetaminophen) .... One By Mouth Qid As Needed For Pain 10)  Ceftin 500 Mg Tab (Cefuroxime Axetil) .... Take One (1) Tablet By Mouth Two (2) Times A Day X 10 Days 11)  Zithromax Tri-Pak 500 Mg Tab (Azithromycin) .... Takeone By Mouth Once Daily For 3 Days  Allergies (verified): No Known Drug Allergies  Past History:  Past Medical  History: Last updated: 07/20/2009 COPD Diabetes mellitus, type II Hyperlipidemia Hypertension  Past Surgical History: Last updated: 07/20/2009 Inguinal herniorrhaphy C-spine abscess surgery 2007 at [facility]  Family History: Last updated: 07/20/2009 Family History Diabetes 1st degree relative Family History Hypertension  Social History: Last updated: 07/20/2009 Occupation: Truck driver Married Current Smoker Alcohol use-yes Drug use-no Regular exercise-no  Risk Factors: Alcohol Use: <1 (08/08/2010) >5 drinks/d w/in last 3 months: no (08/08/2010) Exercise: no (07/20/2009)  Risk Factors: Smoking Status: current (08/08/2010) Packs/Day: 1.0 (08/08/2010)  Family History: Reviewed history from 07/20/2009 and no changes required. Family History Diabetes 1st degree relative Family History Hypertension  Social History: Reviewed history from 07/20/2009 and no changes required. Occupation: Truck Hospital doctor Married Current Smoker Alcohol use-yes Drug use-no Regular exercise-no  Review of Systems  The patient denies anorexia, fever, weight loss, weight gain, decreased hearing, hoarseness, chest pain, syncope, dyspnea on exertion, peripheral edema, headaches, hemoptysis, abdominal pain, hematuria, muscle weakness, suspicious skin lesions, depression, unusual weight change, abnormal bleeding, enlarged lymph nodes, and angioedema.   Endo:  Complains of polyuria; denies cold intolerance, excessive hunger, excessive thirst, excessive urination, heat intolerance, and weight change.  Physical Exam  General:  Well-developed, well-nourished, in no acute distress; alert and oriented x 3.   Head:  normocephalic, atraumatic, no abnormalities observed, and no abnormalities palpated.   Eyes:  vision grossly intact, pupils equal, pupils round, and pupils reactive to light.   Ears:  R ear normal and L ear normal.   Nose:  no external deformity, no nasal discharge, no mucosal pallor, no airflow  obstruction, no intranasal foreign body, no nasal polyps, no nasal mucosal lesions, no mucosal friability, no active bleeding or clots, no sinus percussion tenderness, no septum abnormalities, mucosal erythema, and mucosal edema.   Mouth:  good dentition, pharynx pink and moist, no erythema, no exudates, no posterior lymphoid hypertrophy, no postnasal drip, no pharyngeal crowing, no lesions, no aphthous ulcers, no erosions, and no tongue abnormalities.   Neck:  midline posterior surgical scar. supple, full ROM, no masses, no thyromegaly, no thyroid nodules or tenderness, no JVD, normal carotid upstroke, no carotid bruits, and no cervical lymphadenopathy.   Lungs:  He has diffuse/bilateral expiratory rhonchi but good air movement and no wheezes, rales, or decreased BS's. Heart:  normal rate, regular rhythm, no murmur, no gallop, no rub, and no JVD.   Abdomen:  soft, non-tender, normal bowel sounds, no distention, no masses, no guarding, no rigidity, no rebound tenderness, no abdominal hernia, no inguinal hernia, no hepatomegaly, and no splenomegaly.   Msk:  No deformity or scoliosis noted of thoracic or lumbar spine.   Pulses:  R and L carotid,radial,femoral,dorsalis pedis and posterior tibial pulses are full and equal bilaterally Extremities:  No clubbing, cyanosis, edema, or deformity noted with normal full range of motion of all joints.   Neurologic:  No cranial nerve deficits noted. Station and gait are normal. Plantar reflexes are down-going bilaterally. DTRs are symmetrical throughout. Sensory, motor and coordinative functions appear intact. Skin:  Intact without suspicious lesions or rashes tattoo(s).   Cervical Nodes:  no anterior cervical adenopathy and no posterior cervical adenopathy.   Axillary Nodes:  no R axillary adenopathy and no L axillary adenopathy.   Inguinal Nodes:  no R inguinal adenopathy and no L inguinal adenopathy.   Psych:  Cognition and judgment appear intact. Alert and  cooperative with normal attention span and concentration. No apparent delusions, illusions, hallucinations  Diabetes Management Exam:    Foot Exam (with socks and/or shoes not present):       Sensory-Pinprick/Light touch:          Left medial foot (L-4): normal          Left dorsal foot (L-5): normal          Left lateral foot (S-1): normal          Right medial foot (L-4): normal  Right dorsal foot (L-5): normal          Right lateral foot (S-1): normal       Sensory-Monofilament:          Left foot: normal          Right foot: normal       Inspection:          Left foot: normal          Right foot: normal       Nails:          Left foot: normal          Right foot: normal   Impression & Recommendations:  Problem # 1:  COUGH (ICD-786.2) Assessment New  Orders: T-2 View CXR (71020TC) Tobacco use cessation intermediate 3-10 minutes (04540)  Problem # 2:  BRONCHITIS-ACUTE (ICD-466.0) Assessment: New  his cbc shows cold agglutinins so will treat him for atypical infection with Zpak and typical bacterial infection with ceftin His updated medication list for this problem includes:    Spiriva Handihaler 18 Mcg Caps (Tiotropium bromide monohydrate) ..... One puff once daily for wheezing    Ceftin 500 Mg Tab (Cefuroxime axetil) .Marland Kitchen... Take one (1) tablet by mouth two (2) times a day x 10 days    Zithromax Tri-pak 500 Mg Tab (Azithromycin) .Marland Kitchen... Takeone by mouth once daily for 3 days  Take antibiotics and other medications as directed. Encouraged to push clear liquids, get enough rest, and take acetaminophen as needed. To be seen in 5-7 days if no improvement, sooner if worse.  Orders: Tobacco use cessation intermediate 3-10 minutes (98119)  Problem # 3:  TRANSAMINASES, SERUM, ELEVATED (ICD-790.4) Assessment: New  Orders: Venipuncture (14782) T-Hepatitis C Antibody (95621-30865) T-Hepatitis Acute Panel (78469-62952) TLB-BMP (Basic Metabolic Panel-BMET)  (80048-METABOL) TLB-CBC Platelet - w/Differential (85025-CBCD) TLB-Hepatic/Liver Function Pnl (80076-HEPATIC) TLB-TSH (Thyroid Stimulating Hormone) (84443-TSH) TLB-A1C / Hgb A1C (Glycohemoglobin) (83036-A1C) TLB-Udip w/ Micro (81001-URINE)  Problem # 4:  HYPERTENSION (ICD-401.9) Assessment: Unchanged  His updated medication list for this problem includes:    Lisinopril 40 Mg Tabs (Lisinopril) .Marland Kitchen... Take 1 tablet by mouth once a day  Orders: Venipuncture (84132) T-Hepatitis C Antibody (44010-27253) T-Hepatitis Acute Panel (66440-34742) TLB-BMP (Basic Metabolic Panel-BMET) (80048-METABOL) TLB-CBC Platelet - w/Differential (85025-CBCD) TLB-Hepatic/Liver Function Pnl (80076-HEPATIC) TLB-TSH (Thyroid Stimulating Hormone) (84443-TSH) TLB-A1C / Hgb A1C (Glycohemoglobin) (83036-A1C) TLB-Udip w/ Micro (81001-URINE) Tobacco use cessation intermediate 3-10 minutes (99406)  BP today: 140/80 Prior BP: 140/90 (04/09/2010)  Prior 10 Yr Risk Heart Disease: Not enough information (10/11/2009)  Labs Reviewed: K+: 4.8 (03/16/2010) Creat: : 1.4 (03/16/2010)     Problem # 5:  DIABETES MELLITUS, TYPE II (ICD-250.00) Assessment: Deteriorated  His updated medication list for this problem includes:    Glipizide 10 Mg Tabs (Glipizide) .Marland Kitchen... 2 once daily    Lisinopril 40 Mg Tabs (Lisinopril) .Marland Kitchen... Take 1 tablet by mouth once a day    Janumet 50-1000 Mg Tabs (Sitagliptin-metformin hcl) ..... One by mouth two times a day for diabetes    Lantus Solostar 100 Unit/ml Soln (Insulin glargine) .Marland KitchenMarland KitchenMarland KitchenMarland Kitchen 50 units subcutaneously once daily  Orders: Venipuncture (59563) T-Hepatitis C Antibody (87564-33295) T-Hepatitis Acute Panel (18841-66063) TLB-BMP (Basic Metabolic Panel-BMET) (80048-METABOL) TLB-CBC Platelet - w/Differential (85025-CBCD) TLB-Hepatic/Liver Function Pnl (80076-HEPATIC) TLB-TSH (Thyroid Stimulating Hormone) (84443-TSH) TLB-A1C / Hgb A1C (Glycohemoglobin) (83036-A1C) TLB-Udip w/ Micro  (81001-URINE)  Problem # 6:  TOBACCO USE (ICD-305.1) Assessment: Unchanged  Encouraged smoking cessation and discussed different methods for smoking cessation.   Orders: Tobacco use  cessation intermediate 3-10 minutes (16109)  Problem # 7:  NECK PAIN, CHRONIC (ICD-723.1) Assessment: Unchanged  His updated medication list for this problem includes:    Hydrocodone-acetaminophen 10-500 Mg Tabs (Hydrocodone-acetaminophen) ..... One by mouth qid as needed for pain  Complete Medication List: 1)  Glipizide 10 Mg Tabs (Glipizide) .... 2 once daily 2)  Lisinopril 40 Mg Tabs (Lisinopril) .... Take 1 tablet by mouth once a day 3)  Pravastatin Sodium 40 Mg Tabs (Pravastatin sodium) .... Take 1 tablet by mouth once a day 4)  Spiriva Handihaler 18 Mcg Caps (Tiotropium bromide monohydrate) .... One puff once daily for wheezing 5)  Freestyle Lite Test Strp (Glucose blood) .... Test once daily 6)  Janumet 50-1000 Mg Tabs (Sitagliptin-metformin hcl) .... One by mouth two times a day for diabetes 7)  Lantus Solostar 100 Unit/ml Soln (Insulin glargine) .... 50 units subcutaneously once daily 8)  Viagra 100 Mg Tabs (Sildenafil citrate) .... Take as directed 9)  Hydrocodone-acetaminophen 10-500 Mg Tabs (Hydrocodone-acetaminophen) .... One by mouth qid as needed for pain 10)  Ceftin 500 Mg Tab (Cefuroxime axetil) .... Take one (1) tablet by mouth two (2) times a day x 10 days 11)  Zithromax Tri-pak 500 Mg Tab (Azithromycin) .... Takeone by mouth once daily for 3 days  Patient Instructions: 1)  Please schedule a follow-up appointment in 2 weeks. 2)  Tobacco is very bad for your health and your loved ones! You Should stop smoking!. 3)  Stop Smoking Tips: Choose a Quit date. Cut down before the Quit date. decide what you will do as a substitute when you feel the urge to smoke(gum,toothpick,exercise). 4)  Check your blood sugars regularly. If your readings are usually above 200 or below 70 you should contact  our office. 5)  It is important that your Diabetic A1c level is checked every 3 months. 6)  See your eye doctor yearly to check for diabetic eye damage. 7)  Check your feet each night for sore areas, calluses or signs of infection. 8)  Check your Blood Pressure regularly. If it is above 130/80: you should make an appointment. 9)  Take your antibiotic as prescribed until ALL of it is gone, but stop if you develop a rash or swelling and contact our office as soon as possible. 10)  Acute bronchitis symptoms for less than 10 days are not helped by antibiotics. take over the counter cough medications. call if no improvment in  5-7 days, sooner if increasing cough, fever, or new symptoms( shortness of breath, chest pain). Prescriptions: ZITHROMAX TRI-PAK 500 MG TAB (AZITHROMYCIN) Takeone by mouth once daily for 3 days  #3 x 0   Entered and Authorized by:   Etta Grandchild MD   Signed by:   Etta Grandchild MD on 08/08/2010   Method used:   Electronically to        [facility]* (retail)       [address] [place], [place]  [address]       Ph: [phone]       Fax: [phone]   RxID:   [id number] CEFTIN 500 MG TAB (CEFUROXIME AXETIL) Take one (1) tablet by mouth two (2) times a day X 10 days  #20 x 1   Entered and Authorized by:   Etta Grandchild MD   Signed by:   Etta Grandchild MD on 08/08/2010   Method used:   Print then Give to Patient  RxID:   [id number] HYDROCODONE-ACETAMINOPHEN 10-500 MG TABS (HYDROCODONE-ACETAMINOPHEN) One by mouth QID as needed for pain  #120 x 2   Entered and Authorized by:   Etta Grandchild MD   Signed by:   Etta Grandchild MD on 08/08/2010   Method used:   Print then Give to Patient   RxID:   [id number]    Orders Added: 1)  Venipuncture [84696] 2)  T-Hepatitis C Antibody [29528-41324] 3)  T-Hepatitis Acute Panel [80074-22940] 4)  T-2 View CXR [71020TC] 5)  TLB-BMP (Basic Metabolic Panel-BMET) [80048-METABOL] 6)  TLB-CBC Platelet  - w/Differential [85025-CBCD] 7)  TLB-Hepatic/Liver Function Pnl [80076-HEPATIC] 8)  TLB-TSH (Thyroid Stimulating Hormone) [84443-TSH] 9)  TLB-A1C / Hgb A1C (Glycohemoglobin) [83036-A1C] 10)  TLB-Udip w/ Micro [81001-URINE] 11)  Tobacco use cessation intermediate 3-10 minutes [99406] 12)  Est. Patient Level V [40102]

## 2010-10-09 ENCOUNTER — Ambulatory Visit: Payer: Self-pay | Admitting: Internal Medicine

## 2010-10-17 ENCOUNTER — Other Ambulatory Visit: Payer: Self-pay

## 2010-10-17 ENCOUNTER — Encounter: Payer: Self-pay | Admitting: Internal Medicine

## 2010-10-17 ENCOUNTER — Ambulatory Visit (INDEPENDENT_AMBULATORY_CARE_PROVIDER_SITE_OTHER): Payer: Self-pay | Admitting: Internal Medicine

## 2010-10-17 ENCOUNTER — Other Ambulatory Visit: Payer: Self-pay | Admitting: Internal Medicine

## 2010-10-17 ENCOUNTER — Ambulatory Visit (INDEPENDENT_AMBULATORY_CARE_PROVIDER_SITE_OTHER)
Admission: RE | Admit: 2010-10-17 | Discharge: 2010-10-17 | Disposition: A | Discharge status: Home / Self Care | Payer: Self-pay | Source: Ambulatory Visit | Attending: Internal Medicine | Admitting: Internal Medicine

## 2010-10-17 ENCOUNTER — Encounter (INDEPENDENT_AMBULATORY_CARE_PROVIDER_SITE_OTHER): Payer: Self-pay | Admitting: *Deleted

## 2010-10-17 DIAGNOSIS — M542 Cervicalgia: Secondary | ICD-10-CM

## 2010-10-17 DIAGNOSIS — F17200 Nicotine dependence, unspecified, uncomplicated: Secondary | ICD-10-CM

## 2010-10-17 DIAGNOSIS — R1084 Generalized abdominal pain: Secondary | ICD-10-CM

## 2010-10-17 DIAGNOSIS — I10 Essential (primary) hypertension: Secondary | ICD-10-CM

## 2010-10-17 DIAGNOSIS — E119 Type 2 diabetes mellitus without complications: Secondary | ICD-10-CM

## 2010-10-17 DIAGNOSIS — E785 Hyperlipidemia, unspecified: Secondary | ICD-10-CM

## 2010-10-17 DIAGNOSIS — B1710 Acute hepatitis C without hepatic coma: Secondary | ICD-10-CM

## 2010-10-17 DIAGNOSIS — B182 Chronic viral hepatitis C: Secondary | ICD-10-CM | POA: Insufficient documentation

## 2010-10-17 DIAGNOSIS — R740 Nonspecific elevation of levels of transaminase and lactic acid dehydrogenase [LDH]: Secondary | ICD-10-CM

## 2010-10-17 DIAGNOSIS — R7401 Elevation of levels of liver transaminase levels: Secondary | ICD-10-CM

## 2010-10-17 LAB — URINALYSIS, ROUTINE W REFLEX MICROSCOPIC
Bilirubin Urine: NEGATIVE
Leukocytes, UA: NEGATIVE
Nitrite: NEGATIVE
pH: 6 (ref 5.0–8.0)

## 2010-10-17 LAB — CBC WITH DIFFERENTIAL/PLATELET
Basophils Absolute: 0 10*3/uL (ref 0.0–0.1)
Eosinophils Absolute: 0.1 10*3/uL (ref 0.0–0.7)
Lymphocytes Relative: 31.3 % (ref 12.0–46.0)
MCHC: 35.9 g/dL (ref 30.0–36.0)
Neutro Abs: 3.9 10*3/uL (ref 1.4–7.7)
Neutrophils Relative %: 57.7 % (ref 43.0–77.0)
Platelets: 181 10*3/uL (ref 150.0–400.0)
RDW: 12.8 % (ref 11.5–14.6)

## 2010-10-17 LAB — LIPASE: Lipase: 92 U/L — ABNORMAL HIGH (ref 11.0–59.0)

## 2010-10-17 LAB — HEPATIC FUNCTION PANEL
Albumin: 3.1 g/dL — ABNORMAL LOW (ref 3.5–5.2)
Alkaline Phosphatase: 73 U/L (ref 39–117)
Bilirubin, Direct: 0.1 mg/dL (ref 0.0–0.3)
Total Bilirubin: 0.5 mg/dL (ref 0.3–1.2)

## 2010-10-17 LAB — CONVERTED CEMR LAB
Marijuana Metabolite: NEGATIVE
Methadone: NEGATIVE
Opiates: POSITIVE — AB
Phencyclidine (PCP): NEGATIVE
Propoxyphene: NEGATIVE

## 2010-10-17 LAB — BASIC METABOLIC PANEL
CO2: 27 mEq/L (ref 19–32)
Calcium: 9.2 mg/dL (ref 8.4–10.5)
Creatinine, Ser: 1.2 mg/dL (ref 0.4–1.5)
GFR: 72.47 mL/min (ref >60.00)
Glucose, Bld: 271 mg/dL — ABNORMAL HIGH (ref 70–99)

## 2010-10-17 LAB — HM DIABETES FOOT EXAM

## 2010-10-18 ENCOUNTER — Telehealth: Payer: Self-pay | Admitting: Internal Medicine

## 2010-10-18 DIAGNOSIS — K859 Acute pancreatitis without necrosis or infection, unspecified: Secondary | ICD-10-CM | POA: Insufficient documentation

## 2010-10-18 DIAGNOSIS — R3129 Other microscopic hematuria: Secondary | ICD-10-CM | POA: Insufficient documentation

## 2010-10-19 ENCOUNTER — Other Ambulatory Visit: Payer: Self-pay | Admitting: Internal Medicine

## 2010-10-19 DIAGNOSIS — R109 Unspecified abdominal pain: Secondary | ICD-10-CM

## 2010-10-22 ENCOUNTER — Other Ambulatory Visit: Payer: Self-pay | Admitting: Internal Medicine

## 2010-10-22 ENCOUNTER — Other Ambulatory Visit: Payer: Self-pay

## 2010-10-22 DIAGNOSIS — B1710 Acute hepatitis C without hepatic coma: Secondary | ICD-10-CM

## 2010-10-25 NOTE — Assessment & Plan Note (Signed)
Summary: CONSTIPATION----STC   Vital Signs:  Patient profile:   45 year old male Height:      69 inches Weight:      204 pounds BMI:     30.23 O2 Sat:      97 % on Room air Temp:     98.8 degrees F oral Pulse rate:   89 / minute Pulse rhythm:   regular Resp:     16 per minute BP sitting:   142 / 74  (left arm) Cuff size:   large  Vitals Entered By: Rock Nephew CMA (October 17, 2010 4:00 PM)  O2 Flow:  Room air CC: Patient c/o constipation,  Is Patient Diabetic? Yes Did you bring your meter with you today? No Pain Assessment Patient in pain? no       Does patient need assistance? Functional Status Self care Ambulation Normal   Primary Care Provider:  Etta Grandchild MD  CC:  Patient c/o constipation and .  History of Present Illness: He returns for f/up and he tells me that he has been sick for about three weeks with nausea, vomiting, and diarrhea. He tells me that he was seen at a hospital in [place] and was given an Rx for Phenergan. He tells me that he is finally feeling better this week but still has some abd discomfort and "soft" BM's.  Preventive Screening-Counseling & Management  Alcohol-Tobacco     Alcohol drinks/day: <1     Alcohol type: beer     >5/day in last 3 mos: no     Alcohol Counseling: not indicated; use of alcohol is not excessive or problematic     Feels need to cut down: no     Feels annoyed by complaints: no     Feels guilty re: drinking: no     Needs 'eye opener' in am: no     Smoking Status: current     Smoking Cessation Counseling: yes     Smoke Cessation Stage: contemplative     Packs/Day: 1.0     Year Started: 1977     Pack years: 30     Tobacco Counseling: to quit use of tobacco products  Hep-HIV-STD-Contraception     Hepatitis Risk: risk noted     Hepatitis Risk Counseling: to avoid increased hepatitis risk     HIV Risk: risk noted     HIV Risk Counseling: not indicated-no HIV risk noted     STD Risk: no risk  noted      Sexual History:  currently monogamous.        Drug Use:  no.        Blood Transfusions:  no.    Clinical Review Panels:  Immunizations   Last Tetanus Booster:  Historical (08/19/2005)   Last Flu Vaccine:  Fluvax 3+ (07/20/2009)   Last Pneumovax:  Historical (08/19/2005)  Diabetes Management   HgBA1C:  8.1 (08/08/2010)   Creatinine:  1.5 (08/08/2010)   Last Dilated Eye Exam:  normal (07/18/2009)   Last Foot Exam:  yes (10/17/2010)   Last Flu Vaccine:  Fluvax 3+ (07/20/2009)   Last Pneumovax:  Historical (08/19/2005)  CBC   WBC:  9.8 (08/08/2010)   RBC:  4.68 (08/08/2010)   Hgb:  15.3 (08/08/2010)   Hct:  43.4 (08/08/2010)   Platelets:  183.0 (08/08/2010)   MCV  92.8 (08/08/2010)   MCHC  35.3 (08/08/2010)   RDW  13.3 (08/08/2010)   PMN:  67.8 (08/08/2010)   Lymphs:  21.2 (  08/08/2010)   Monos:  8.7 (08/08/2010)   Eosinophils:  1.8 (08/08/2010)   Basophil:  0.5 (08/08/2010)  Complete Metabolic Panel   Glucose:  207 (08/08/2010)   Sodium:  134 (08/08/2010)   Potassium:  4.6 (08/08/2010)   Chloride:  101 (08/08/2010)   CO2:  23 (08/08/2010)   BUN:  28 (08/08/2010)   Creatinine:  1.5 (08/08/2010)   Albumin:  3.5 (08/08/2010)   Total Protein:  6.7 (08/08/2010)   Calcium:  9.5 (08/08/2010)   Total Bili:  0.9 (08/08/2010)   Alk Phos:  79 (08/08/2010)   SGPT (ALT):  51 (08/08/2010)   SGOT (AST):  35 (08/08/2010)   Medications Prior to Update: 1)  Glipizide 10 Mg Tabs (Glipizide) .... 2 Once Daily 2)  Lisinopril 40 Mg Tabs (Lisinopril) .... Take 1 Tablet By Mouth Once A Day 3)  Pravastatin Sodium 40 Mg Tabs (Pravastatin Sodium) .... Take 1 Tablet By Mouth Once A Day 4)  Spiriva Handihaler 18 Mcg Caps (Tiotropium Bromide Monohydrate) .... One Puff Once Daily For Wheezing 5)  Freestyle Lite Test  Strp (Glucose Blood) .... Test Once Daily 6)  Janumet 50-1000 Mg Tabs (Sitagliptin-Metformin Hcl) .... One By Mouth Two Times A Day For Diabetes 7)  Lantus Solostar  100 Unit/ml Soln (Insulin Glargine) .... 50 Units Subcutaneously Once Daily 8)  Viagra 100 Mg Tabs (Sildenafil Citrate) .... Take As Directed 9)  Hydrocodone-Acetaminophen 10-500 Mg Tabs (Hydrocodone-Acetaminophen) .... One By Mouth Qid As Needed For Pain  Current Medications (verified): 1)  Glipizide 10 Mg Tabs (Glipizide) .... 2 Once Daily 2)  Lisinopril 40 Mg Tabs (Lisinopril) .... Take 1 Tablet By Mouth Once A Day 3)  Pravastatin Sodium 40 Mg Tabs (Pravastatin Sodium) .... Take 1 Tablet By Mouth Once A Day 4)  Spiriva Handihaler 18 Mcg Caps (Tiotropium Bromide Monohydrate) .... One Puff Once Daily For Wheezing 5)  Freestyle Lite Test  Strp (Glucose Blood) .... Test Once Daily 6)  Janumet 50-1000 Mg Tabs (Sitagliptin-Metformin Hcl) .... One By Mouth Two Times A Day For Diabetes 7)  Lantus Solostar 100 Unit/ml Soln (Insulin Glargine) .... 50 Units Subcutaneously Once Daily 8)  Viagra 100 Mg Tabs (Sildenafil Citrate) .... Take As Directed 9)  Hydrocodone-Acetaminophen 10-500 Mg Tabs (Hydrocodone-Acetaminophen) .... One By Mouth Qid As Needed For Pain  Allergies (verified): No Known Drug Allergies  Past History:  Past Medical History: Last updated: 07/20/2009 COPD Diabetes mellitus, type II Hyperlipidemia Hypertension  Past Surgical History: Last updated: 07/20/2009 Inguinal herniorrhaphy C-spine abscess surgery 2007 at [facility]  Family History: Last updated: 07/20/2009 Family History Diabetes 1st degree relative Family History Hypertension  Social History: Last updated: 07/20/2009 Occupation: Truck driver Married Current Smoker Alcohol use-yes Drug use-no Regular exercise-no  Risk Factors: Alcohol Use: <1 (10/17/2010) >5 drinks/d w/in last 3 months: no (10/17/2010) Exercise: no (07/20/2009)  Risk Factors: Smoking Status: current (10/17/2010) Packs/Day: 1.0 (10/17/2010)  Family History: Reviewed history from 07/20/2009 and no changes required. Family History  Diabetes 1st degree relative Family History Hypertension  Social History: Reviewed history from 07/20/2009 and no changes required. Occupation: Truck Hospital doctor Married Current Smoker Alcohol use-yes Drug use-no Regular exercise-no  Review of Systems       The patient complains of abdominal pain.  The patient denies anorexia, fever, weight loss, weight gain, chest pain, syncope, dyspnea on exertion, peripheral edema, prolonged cough, headaches, hemoptysis, melena, hematochezia, severe indigestion/heartburn, hematuria, suspicious skin lesions, transient blindness, difficulty walking, depression, enlarged lymph nodes, and angioedema.   GI:  Complains  of abdominal pain, change in bowel habits, diarrhea, nausea, and vomiting; denies bloody stools, constipation, dark tarry stools, excessive appetite, gas, hemorrhoids, indigestion, loss of appetite, vomiting blood, and yellowish skin color. Endo:  Denies cold intolerance, excessive hunger, excessive thirst, excessive urination, heat intolerance, polyuria, and weight change.  Physical Exam  General:  alert, well-developed, well-nourished, well-hydrated, appropriate dress, normal appearance, healthy-appearing, and cooperative to examination.   Head:  normocephalic and atraumatic.   Eyes:  vision grossly intact, pupils equal, and no injection or icterus.   Ears:  R ear normal and L ear normal.   Mouth:  Oral mucosa and oropharynx without lesions or exudates.  Teeth in good repair. Neck:  midline posterior surgical scar. supple, full ROM, no masses, no thyromegaly, no thyroid nodules or tenderness, no JVD, normal carotid upstroke, no carotid bruits, and no cervical lymphadenopathy.   Lungs:  He has diffuse/bilateral expiratory rhonchi but good air movement and no wheezes, rales, or decreased BS's. Heart:  normal rate, regular rhythm, no murmur, no gallop, no rub, and no JVD.   Abdomen:  soft, non-tender, no distention, no masses, no guarding, no  rigidity, no rebound tenderness, no abdominal hernia, no inguinal hernia, no hepatomegaly, no splenomegaly, and bowel sounds hyperactive.   Rectal:  No external abnormalities noted. Normal sphincter tone. No rectal masses or tenderness. heme negative stool. Genitalia:  circumcised, no hydrocele, no varicocele, no scrotal masses, no testicular masses or atrophy, no cutaneous lesions, and no urethral discharge.   Msk:  No deformity or scoliosis noted of thoracic or lumbar spine.   Pulses:  R and L carotid,radial,femoral,dorsalis pedis and posterior tibial pulses are full and equal bilaterally Extremities:  No clubbing, cyanosis, edema, or deformity noted with normal full range of motion of all joints.   Neurologic:  No cranial nerve deficits noted. Station and gait are normal. Plantar reflexes are down-going bilaterally. DTRs are symmetrical throughout. Sensory, motor and coordinative functions appear intact. Skin:  Intact without suspicious lesions or rashes tattoo(s).   Cervical Nodes:  no anterior cervical adenopathy and no posterior cervical adenopathy.   Axillary Nodes:  no R axillary adenopathy and no L axillary adenopathy.   Inguinal Nodes:  no R inguinal adenopathy and no L inguinal adenopathy.   Psych:  Cognition and judgment appear intact. Alert and cooperative with normal attention span and concentration. No apparent delusions, illusions, hallucinations  Diabetes Management Exam:    Foot Exam (with socks and/or shoes not present):       Sensory-Pinprick/Light touch:          Left medial foot (L-4): normal          Left dorsal foot (L-5): normal          Left lateral foot (S-1): normal          Right medial foot (L-4): normal          Right dorsal foot (L-5): normal          Right lateral foot (S-1): normal       Sensory-Monofilament:          Left foot: normal          Right foot: normal       Inspection:          Left foot: normal          Right foot: normal       Nails:           Left foot: normal  Right foot: normal   Impression & Recommendations:  Problem # 1:  ABDOMINAL PAIN, GENERALIZED (ICD-789.07) Assessment New  Orders: T-Abdomen 2-view (74020TC) TLB-Amylase (82150-AMYL) TLB-Lipase (83690-LIPASE)  Problem # 2:  HEPATITIS C (ICD-070.51) Assessment: New  Orders: Hepatitis C Clinic Referral (HepC) Venipuncture (13086) TLB-BMP (Basic Metabolic Panel-BMET) (80048-METABOL) TLB-CBC Platelet - w/Differential (85025-CBCD) TLB-Hepatic/Liver Function Pnl (80076-HEPATIC) Radiology Referral (Radiology) T-Alpha-Fetoprotein Serum (57846-96295) T-Drug Screen-Urine, (single) (28413-24401) TLB-Udip ONLY (81003-UDIP)  Problem # 3:  TRANSAMINASES, SERUM, ELEVATED (ICD-790.4) Assessment: Unchanged  Orders: Venipuncture (02725) TLB-BMP (Basic Metabolic Panel-BMET) (80048-METABOL) TLB-CBC Platelet - w/Differential (85025-CBCD) TLB-Hepatic/Liver Function Pnl (80076-HEPATIC) Radiology Referral (Radiology) T-Alpha-Fetoprotein Serum (36644-03474) TLB-Udip ONLY (81003-UDIP)  Problem # 4:  NECK PAIN, CHRONIC (ICD-723.1) Assessment: Unchanged  His updated medication list for this problem includes:    Hydrocodone-acetaminophen 10-500 Mg Tabs (Hydrocodone-acetaminophen) ..... One by mouth qid as needed for pain  Problem # 5:  HYPERTENSION (ICD-401.9) Assessment: Unchanged  His updated medication list for this problem includes:    Lisinopril 40 Mg Tabs (Lisinopril) .Marland Kitchen... Take 1 tablet by mouth once a day  Orders: Venipuncture (25956) TLB-BMP (Basic Metabolic Panel-BMET) (80048-METABOL) TLB-CBC Platelet - w/Differential (85025-CBCD) TLB-Hepatic/Liver Function Pnl (80076-HEPATIC) TLB-Udip ONLY (81003-UDIP) Hemoccult Guaiac-1 spec.(in office) (82270)  BP today: 142/74 Prior BP: 140/80 (08/08/2010)  Prior 10 Yr Risk Heart Disease: Not enough information (10/11/2009)  Labs Reviewed: K+: 4.6 (08/08/2010) Creat: : 1.5 (08/08/2010)     Problem #  6:  DIABETES MELLITUS, TYPE II (ICD-250.00) Assessment: Unchanged  His updated medication list for this problem includes:    Glipizide 10 Mg Tabs (Glipizide) .Marland Kitchen... 2 once daily    Lisinopril 40 Mg Tabs (Lisinopril) .Marland Kitchen... Take 1 tablet by mouth once a day    Janumet 50-1000 Mg Tabs (Sitagliptin-metformin hcl) ..... One by mouth two times a day for diabetes    Lantus Solostar 100 Unit/ml Soln (Insulin glargine) .Marland KitchenMarland KitchenMarland KitchenMarland Kitchen 50 units subcutaneously once daily  Orders: Venipuncture (38756) TLB-BMP (Basic Metabolic Panel-BMET) (80048-METABOL) TLB-CBC Platelet - w/Differential (85025-CBCD) TLB-Hepatic/Liver Function Pnl (80076-HEPATIC) TLB-A1C / Hgb A1C (Glycohemoglobin) (83036-A1C) T-Drug Screen-Urine, (single) (43329-51884) TLB-Udip ONLY (81003-UDIP) Ophthalmology Referral (Ophthalmology)  Labs Reviewed: Creat: 1.5 (08/08/2010)     Last Eye Exam: normal (07/18/2009) Reviewed HgBA1c results: 8.1 (08/08/2010)  8.2 (03/16/2010)  Complete Medication List: 1)  Glipizide 10 Mg Tabs (Glipizide) .... 2 once daily 2)  Lisinopril 40 Mg Tabs (Lisinopril) .... Take 1 tablet by mouth once a day 3)  Pravastatin Sodium 40 Mg Tabs (Pravastatin sodium) .... Take 1 tablet by mouth once a day 4)  Spiriva Handihaler 18 Mcg Caps (Tiotropium bromide monohydrate) .... One puff once daily for wheezing 5)  Freestyle Lite Test Strp (Glucose blood) .... Test once daily 6)  Janumet 50-1000 Mg Tabs (Sitagliptin-metformin hcl) .... One by mouth two times a day for diabetes 7)  Lantus Solostar 100 Unit/ml Soln (Insulin glargine) .... 50 units subcutaneously once daily 8)  Viagra 100 Mg Tabs (Sildenafil citrate) .... Take as directed 9)  Hydrocodone-acetaminophen 10-500 Mg Tabs (Hydrocodone-acetaminophen) .... One by mouth qid as needed for pain  Colorectal Screening:  Current Recommendations:    Hemoccult: NEG X 1 today  Immunization & Chemoprophylaxis:    Tetanus vaccine: Historical  (08/19/2005)    Influenza  vaccine: Fluvax 3+  (07/20/2009)    Pneumovax: Historical  (08/19/2005)  Patient Instructions: 1)  Please schedule a follow-up appointment in 2 weeks. 2)  Tobacco is very bad for your health and your loved ones! You Should stop smoking!. 3)  Stop  Smoking Tips: Choose a Quit date. Cut down before the Quit date. decide what you will do as a substitute when you feel the urge to smoke(gum,toothpick,exercise). 4)  Check your blood sugars regularly. If your readings are usually above 200 or below 70 you should contact our office. 5)  It is important that your Diabetic A1c level is checked every 3 months. 6)  See your eye doctor yearly to check for diabetic eye damage. 7)  Check your feet each night for sore areas, calluses or signs of infection. 8)  Check your Blood Pressure regularly. If it is above 130/80: you should make an appointment. 9)  teh main problem with gastroenteritis is dehydration. Drink plenty of fluids and take solids as you feel better. If you are unable to keep anything down and/or you show signs of dehydration(dry/cracked lips, lack of tears, not urinating, very sleepy), call our office. Prescriptions: HYDROCODONE-ACETAMINOPHEN 10-500 MG TABS (HYDROCODONE-ACETAMINOPHEN) One by mouth QID as needed for pain  #120 x 2   Entered and Authorized by:   Etta Grandchild MD   Signed by:   Etta Grandchild MD on 10/17/2010   Method used:   Print then Give to Patient   RxID:   [id number]    Orders Added: 1)  Hepatitis C Clinic Referral [HepC] 2)  Venipuncture [29518] 3)  TLB-BMP (Basic Metabolic Panel-BMET) [80048-METABOL] 4)  TLB-CBC Platelet - w/Differential [85025-CBCD] 5)  TLB-Hepatic/Liver Function Pnl [80076-HEPATIC] 6)  TLB-A1C / Hgb A1C (Glycohemoglobin) [83036-A1C] 7)  Radiology Referral [Radiology] 8)  T-Alpha-Fetoprotein Serum [82105-81230] 9)  T-Drug Screen-Urine, (single) [80101-82900] 10)  T-Abdomen 2-view [74020TC] 11)  TLB-Amylase [82150-AMYL] 12)  TLB-Lipase  [83690-LIPASE] 13)  TLB-Udip ONLY [81003-UDIP] 14)  Hemoccult Guaiac-1 spec.(in office) [82270] 15)  Est. Patient Level V [84166] 16)  Ophthalmology Referral [Ophthalmology]

## 2010-10-25 NOTE — Progress Notes (Signed)
     New Problems: MICROSCOPIC HEMATURIA (ICD-599.72) ACUTE PANCREATITIS (ICD-577.0)   New Problems: MICROSCOPIC HEMATURIA (ICD-599.72) ACUTE PANCREATITIS (ICD-577.0)

## 2010-11-02 ENCOUNTER — Ambulatory Visit: Payer: Self-pay | Admitting: Internal Medicine

## 2010-11-05 ENCOUNTER — Other Ambulatory Visit: Payer: Self-pay

## 2010-11-09 ENCOUNTER — Ambulatory Visit: Payer: Self-pay | Admitting: Internal Medicine

## 2010-11-09 DIAGNOSIS — Z0289 Encounter for other administrative examinations: Secondary | ICD-10-CM

## 2010-12-07 ENCOUNTER — Ambulatory Visit: Payer: Self-pay | Admitting: Internal Medicine

## 2010-12-07 DIAGNOSIS — Z0289 Encounter for other administrative examinations: Secondary | ICD-10-CM

## 2010-12-24 ENCOUNTER — Encounter: Payer: Self-pay | Admitting: Internal Medicine

## 2010-12-25 ENCOUNTER — Encounter: Payer: Self-pay | Admitting: Internal Medicine

## 2010-12-25 ENCOUNTER — Other Ambulatory Visit: Payer: Self-pay

## 2010-12-25 ENCOUNTER — Ambulatory Visit (INDEPENDENT_AMBULATORY_CARE_PROVIDER_SITE_OTHER): Payer: Self-pay | Admitting: Internal Medicine

## 2010-12-25 DIAGNOSIS — R3129 Other microscopic hematuria: Secondary | ICD-10-CM

## 2010-12-25 DIAGNOSIS — E785 Hyperlipidemia, unspecified: Secondary | ICD-10-CM

## 2010-12-25 DIAGNOSIS — F1410 Cocaine abuse, uncomplicated: Secondary | ICD-10-CM

## 2010-12-25 DIAGNOSIS — B1710 Acute hepatitis C without hepatic coma: Secondary | ICD-10-CM

## 2010-12-25 DIAGNOSIS — J449 Chronic obstructive pulmonary disease, unspecified: Secondary | ICD-10-CM

## 2010-12-25 DIAGNOSIS — E119 Type 2 diabetes mellitus without complications: Secondary | ICD-10-CM

## 2010-12-25 DIAGNOSIS — G894 Chronic pain syndrome: Secondary | ICD-10-CM

## 2010-12-25 DIAGNOSIS — I10 Essential (primary) hypertension: Secondary | ICD-10-CM

## 2010-12-25 DIAGNOSIS — K859 Acute pancreatitis without necrosis or infection, unspecified: Secondary | ICD-10-CM

## 2010-12-25 DIAGNOSIS — F17200 Nicotine dependence, unspecified, uncomplicated: Secondary | ICD-10-CM

## 2010-12-25 DIAGNOSIS — M542 Cervicalgia: Secondary | ICD-10-CM

## 2010-12-25 LAB — C-PEPTIDE: C-Peptide: 7.4 ng/mL — ABNORMAL HIGH (ref 0.80–3.90)

## 2010-12-25 MED ORDER — HYDROCODONE-ACETAMINOPHEN 10-500 MG PO TABS: 1.0000 | ORAL_TABLET | Freq: Four times a day (QID) | ORAL | Status: DC | PRN

## 2010-12-25 MED ORDER — GLIPIZIDE 10 MG PO TABS: 20.0000 mg | ORAL_TABLET | Freq: Every day | ORAL | Status: DC

## 2010-12-25 NOTE — Assessment & Plan Note (Signed)
This is unchanged and caused by the Hep C infection

## 2010-12-25 NOTE — Assessment & Plan Note (Signed)
When I last saw him his UDS was + for cocaine, he tells me that he did that once recently at a party and that he has not used since then and does not plan to abuse cocaine again, he does want any intervention or help with cocaine addiction, he wants me to recheck his UDS again today to document that he is drug free.

## 2010-12-25 NOTE — Assessment & Plan Note (Signed)
He will not do the CT scan b/c it costs too much even he knows that I concerned about stones and bladder cancer, he has no s/s today, I will recheck his urine today.

## 2010-12-25 NOTE — Assessment & Plan Note (Signed)
Continue current regimen for pain relief

## 2010-12-25 NOTE — Assessment & Plan Note (Signed)
He is doing well on pravastatin 

## 2010-12-25 NOTE — Assessment & Plan Note (Signed)
It appears that this has resolved, I will check his enzyme levels today

## 2010-12-25 NOTE — Assessment & Plan Note (Signed)
He is doing well on spiriva 

## 2010-12-25 NOTE — Progress Notes (Signed)
Subjective:    Patient ID: Alvin Figueroa, male    DOB: 1966/01/22, 45 y.o.   MRN: [id number]  Diabetes He presents for his follow-up diabetic visit. He has type 2 diabetes mellitus. His disease course has been fluctuating. There are no hypoglycemic associated symptoms. Pertinent negatives for hypoglycemia include no confusion, dizziness, headaches, nervousness/anxiousness, pallor, seizures, speech difficulty, sweats or tremors. Pertinent negatives for diabetes include no blurred vision, no chest pain, no fatigue, no foot paresthesias, no foot ulcerations, no polydipsia, no polyphagia, no polyuria, no visual change, no weakness and no weight loss. There are no hypoglycemic complications. Symptoms are stable. There are no diabetic complications. Current diabetic treatment includes oral agent (triple therapy) and insulin injections. He is compliant with treatment some of the time. His weight is stable. He is following a generally healthy diet. Meal planning includes avoidance of concentrated sweets. He has not had a previous visit with a dietician. He participates in exercise intermittently. There is no change in his home blood glucose trend. His breakfast blood glucose range is generally 90-110 mg/dl. His lunch blood glucose range is generally 110-130 mg/dl. His dinner blood glucose range is generally 110-130 mg/dl. His highest blood glucose is 110-130 mg/dl. His overall blood glucose range is 110-130 mg/dl. An ACE inhibitor/angiotensin II receptor blocker is being taken. He does not see a podiatrist.Eye exam is not current.  Hypertension This is a chronic problem. The current episode started more than 1 year ago. The problem has been gradually improving since onset. The problem is controlled. Associated symptoms include neck pain (unchanged, chronic, no radicular symptoms). Pertinent negatives include no anxiety, blurred vision, chest pain, headaches, malaise/fatigue, orthopnea, palpitations, peripheral  edema, PND, shortness of breath or sweats. There are no associated agents to hypertension. Past treatments include ACE inhibitors. The current treatment provides moderate improvement. Compliance problems include exercise and diet.       Review of Systems  Constitutional: Negative for fever, chills, weight loss, malaise/fatigue, diaphoresis, activity change, appetite change, fatigue and unexpected weight change.  HENT: Positive for neck pain (unchanged, chronic, no radicular symptoms). Negative for sore throat, facial swelling, drooling, trouble swallowing, neck stiffness and voice change.   Eyes: Negative for blurred vision, photophobia and visual disturbance.  Respiratory: Negative for apnea, cough, choking, chest tightness, shortness of breath, wheezing and stridor.   Cardiovascular: Negative for chest pain, palpitations, orthopnea, leg swelling and PND.  Gastrointestinal: Negative for nausea, vomiting, abdominal pain, diarrhea, constipation, blood in stool and abdominal distention.  Genitourinary: Negative for dysuria, urgency, polyuria, frequency, hematuria, flank pain, decreased urine volume and difficulty urinating.  Musculoskeletal: Negative for myalgias, back pain, joint swelling, arthralgias and gait problem.  Skin: Negative for color change, pallor and rash.  Neurological: Negative for dizziness, tremors, seizures, syncope, facial asymmetry, speech difficulty, weakness, light-headedness, numbness and headaches.  Hematological: Negative for polydipsia, polyphagia and adenopathy. Does not bruise/bleed easily.  Psychiatric/Behavioral: Negative for suicidal ideas, hallucinations, behavioral problems, confusion, sleep disturbance, self-injury, dysphoric mood, decreased concentration and agitation. The patient is not nervous/anxious and is not hyperactive.        Objective:   Physical Exam  Constitutional: He is oriented to person, place, and time. He appears well-developed and  well-nourished. No distress.  HENT:  Head: Normocephalic and atraumatic.  Right Ear: External ear normal.  Left Ear: External ear normal.  Nose: Nose normal.  Mouth/Throat: Oropharynx is clear and moist.  Eyes: Conjunctivae and EOM are normal. Pupils are equal, round, and reactive to light. Right  eye exhibits no discharge. Left eye exhibits no discharge. No scleral icterus.  Neck: Normal range of motion. Neck supple. No JVD present. No tracheal deviation present. No thyromegaly present.  Cardiovascular: Normal rate, regular rhythm, normal heart sounds and intact distal pulses.  Exam reveals no gallop and no friction rub.   No murmur heard. Pulmonary/Chest: Effort normal and breath sounds normal. No stridor. No respiratory distress. He has no wheezes. He has no rales. He exhibits no tenderness.  Abdominal: Soft. Bowel sounds are normal. He exhibits no distension and no mass. There is no tenderness. There is no rebound and no guarding.  Musculoskeletal: Normal range of motion. He exhibits no edema and no tenderness.  Lymphadenopathy:    He has no cervical adenopathy.  Neurological: He is alert and oriented to person, place, and time. He has normal reflexes. He displays normal reflexes. No cranial nerve deficit. He exhibits normal muscle tone. Coordination normal.  Skin: Skin is warm and dry. No rash noted. He is not diaphoretic. No erythema. No pallor.  Psychiatric: He has a normal mood and affect. His behavior is normal. Judgment and thought content normal.         Lab Results  Component Value Date   WBC 6.7 10/17/2010   HGB 13.4 10/17/2010   HCT 37.4* 10/17/2010   PLT 181.0 10/17/2010   ALT 36 10/17/2010   AST 26 10/17/2010   NA 136 10/17/2010   K 4.1 10/17/2010   CL 103 10/17/2010   CREATININE 1.2 10/17/2010   BUN 20 10/17/2010   CO2 27 10/17/2010   TSH 1.77 08/08/2010   HGBA1C 7.6* 10/17/2010   Assessment & Plan:

## 2010-12-25 NOTE — Assessment & Plan Note (Signed)
His BS report sounds good, today I will check his A1C level and monitor his renal function

## 2010-12-25 NOTE — Assessment & Plan Note (Signed)
His BP is well controlled, I will monitor his lytes and renal function today 

## 2010-12-25 NOTE — Assessment & Plan Note (Signed)
He is referred to the Hep C clinic to see if he needs to be treated

## 2010-12-25 NOTE — Patient Instructions (Signed)
Diabetes, Type 2 Diabetes is a lasting (chronic) disease. In type 2 diabetes, the pancreas does not make enough insulin (a hormone), and the body does not respond normally to the insulin that is made. This type of diabetes was also previously called adult onset diabetes. About 90% of all those who have diabetes have type 2. It usually occurs after the age of 88 but can occur at any age. CAUSES Unlike type 1 diabetes, which happens because insulin is no longer being made, type 2 diabetes happens because the body is making less insulin and has trouble using the insulin properly. SYMPTOMS  Drinking more than usual.   Urinating more than usual.   Blurred vision.   Dry, itchy skin.   Frequent infection like yeast infections in women.   More tired than usual (fatigue).  TREATMENT  Healthy eating.   Exercise.   Medication, if needed.   Monitoring blood glucose (sugar).   Seeing your caregiver regularly.  HOME CARE INSTRUCTIONS  Check your blood glucose (sugar) at least once daily. More frequent monitoring may be necessary, depending on your medications and on how well your diabetes is controlled. Your caregiver will advise you.   Take your medicine as directed by your caregiver.   Do not smoke.   Make wise food choices. Ask your caregiver for information. Weight loss can improve your diabetes.   Learn about low blood glucose (hypoglycemia) and how to treat it.   Get your eyes checked regularly.   Have a yearly physical exam. Have your blood pressure checked. Get your blood and urine tested.   Wear a pendant or bracelet saying that you have diabetes.   Check your feet every night for sores. Let your caregiver know if you have sores that are not healing.  SEEK MEDICAL CARE IF:  You are having problems keeping your blood glucose at target range.   You feel you might be having problems with your medicines.   You have symptoms of an illness that is not improving after 24  hours.   You have a sore or wound that is not healing.   You notice a change in vision or a new problem with your vision.  You develop a fever of more than 100.5Hepatitis C (Viral Infection of the Liver) Hepatitis C is a viral infection of the liver similar to Hepatitis B (previously called serum hepatitis). This infection may go undetected for months or years because symptoms may be absent or be very mild and not lead a person to seek medical care until well after the infection actually began. Chronic liver disease is the main danger of Hepatitis C. This may lead to scarring of the liver (cirrhosis), liver failure and liver cancer. CAUSES This infection is now most commonly transmitted from person to person through intravenous drug use and abuse, sharing needles, or intimate sexual relationships with a hepatitis C-infected person.  SYMPTOMS When symptoms are present, they may consist of: Mild fatigue.  Darkening of the color of urine.  Sometimes mild yellowing of the skin and eyes (jaundice). Jaundice occurs because of the build up of bile in the blood.  DIAGNOSIS Diagnosis of active or chronic hepatitis C infection is made by testing blood for the presence of hepatitis C viral particles called RNA. Other tests to measure the status of current liver function, exclude other liver problems, or assess liver damage may also be done or advised. TREATMENT Drug treatment is available and recommended for patients with an increased risk of  developing cirrhosis caused by the virus.  HOME CARE INSTRUCTIONS To avoid making your liver disease worse: Strictly avoid all alcohol beverage ingestion.  Carefully review all new prescriptions of medicines with your doctors. Avoid drugs that are toxic to the liver. Some of these are:  Isoniazid.  Methyldopa.  Acetaminophen.  Anabolic steroids (muscle building drugs).  Erythromycin.  Oral contraceptives (birth control pills).  Check with your caregiver to  make sure medicine you are currently taking will not be harmful.  Periodic blood tests may be required. Follow your caregiver's advice about when you should have blood tests.  Avoid a physical/sexual relationship until advised otherwise by your caregiver.  Avoid activities that could expose other persons to your blood. Examples include sharing a toothbrush, nail clippers, razors, and needles.  Bed rest is not necessary. It may make you feel better. Recovery time is not related to the amount of rest you receive.  This infection is contagious. Follow the instructions in this section and any additional instructions from your caregiver to avoid spread of your infection to others. SEEK MEDICAL CARE IF: You have increasing fatigue or weakness.  An oral temperature above 100.5Chemical Dependency and Cocaine Abuse WHEN IS DRUG USE A PROBLEM? Anytime drug use is interfering with normal living activities it has become abuse. This includes problems with family and friends. Psychological dependence has developed when your mind tells you that the drug is needed. This is usually followed by physical dependence which has developed when continuing increases of drug are required to get the same feeling or "high". This is known as addiction or chemical dependency. A person's risk is much higher if there is a history of chemical dependency in the family.  SIGNS OF CHEMICAL DEPENDENCY: Been told by friends or family that drugs have become a problem  Fighting when using drugs  Having blackouts (not remembering what you do while using).  Feel sick from using drugs but continue using  Lie about use or amounts of drugs (chemicals) used.  Need chemicals to get you going.  Suffer in work International aid/development worker or school because of drug use.  Get sick from use of drugs but continue to use anyway.  Need drugs to relate to people or feel comfortable in social situations.  Use drugs to forget problems.  Yes answered to any of the  above signs of chemical dependency indicates there are problems. The longer the use of drugs continues, the greater the problems will become. If there is a family history of drug or alcohol use it is best not to experiment with these drugs. Experimentation leads to tolerance and needing to use more of the drug to get the same feeling. This is followed by addiction where drugs become the most important part of life. It becomes more important to take drugs than participate in the other usual activities of life including relating to friends and family. Addiction is followed by dependency where drugs are now needed not just to get high but to feel normal. Addiction cannot be cured but it can be stopped. This often requires outside help and the care of professionals. Treatment centers are listed in the yellow pages under: Cocaine, Narcotics, and Alcoholics Anonymous. Most hospitals and clinics can refer you to a specialized care center. WHAT IS COCAINE? Cocaine is a strong nervous system stimulant which speeds up the body and gives the user the feeling that they have increased energy, loss of appetite and feelings of great pleasure. This "high" which begins within several  minutes and lasts for less than an hour is followed by a "crash". The crash and depressed feelings that come with it cause a craving for the drug to regain the high. HOW IS COCANINE USED? Cocaine is snorted, injected, and smoked as free- base or crack. Because smoking the drug produces a greater high it is also associated with a greater low. It is therefore more rapidly addicting. WHAT ARE THE EFFECTS OF COCAINE? It is an anesthetic (pain killer) and a stimulant (it causes a high which gives a false feeling of well being). It increases heart and breathing rates with increases in body temperature and blood pressure. It removes appetite. It causes seizures (convulsions) along with nausea (feeling sick to your stomach), vomiting and stomach pain.  This dangerous combination can lead to death. Trying to keep the high feeling leads to greater and greater drug use and this leads to addiction. Addiction can only be helped by stopping use of all chemicals. This is hard but may save your life. If the addiction is continued, the only possible outcome is loss of self respect and self esteem, violence, death, and eventually prison if the addict is fortunate enough to be caught and able to receive help prior to this last life ending event. OTHER HEALTH RISKS OF COCAINE AND ALL DRUG USE ARE: The increased possibility of getting AIDS or hepatitis (liver inflammation).  Having a baby born which is addicted to cocaine and must go through painful withdrawal including shaking, jerking, and crying in pain. Many of the babies die. Other babies go through life with lifelong disabilities and learning problems.  HOW TO STAY DRUG FREE ONCE YOU HAVE QUIT USING: Develop healthy activities and form friends who do not use drugs.  Stay away from the drug scene.  Tell the pusher or former friend you have other better things to do.  Have ready excuses available about why you cannot use.  FOR MORE HELP OR INFORMATION CONTACT YOUR LOCAL PHYSICIAN, CLINIC, HOSPITAL OR DIAL 1-800-COCAINE 704-109-4108). Document Released: 08/02/2000 Document Re-Released: 05/14/2008 [facility] Patient Information 2011 [place], [place]. develops, or as your caregiver suggests.  You develop loss of appetite or have nausea and vomiting.  You develop jaundice.  You develop easy bruising or bleeding.  MAKE SURE YOU:  Understand these instructions.  Will watch your condition.  Will get help right away if you are not doing well or get worse.  Document Released: 08/02/2000 Document Re-Released: 10/30/2009  [facility] Patient Information 2011 [place], [place]..  Document Released: 08/05/2005 Document Re-Released: 08/27/2009 [facility] Patient Information 2011 [place], [place].

## 2010-12-26 LAB — DRUGS OF ABUSE SCREEN W/O ALC, ROUTINE URINE
Amphetamine Screen, Ur: NEGATIVE
Benzodiazepines.: NEGATIVE
Cocaine Metabolites: NEGATIVE
Phencyclidine (PCP): NEGATIVE

## 2010-12-28 LAB — OPIATE, QUANTITATIVE, URINE
Codeine Urine: NEGATIVE NG/ML
Hydrocodone: 1533 NG/ML — ABNORMAL HIGH
Morphine Urine: NEGATIVE NG/ML

## 2010-12-28 LAB — ANTI-ISLET CELL ANTIBODY: Pancreatic Islet Cell Antibody: <5 JDF Units (ref <5)

## 2011-01-21 ENCOUNTER — Telehealth: Payer: Self-pay | Admitting: *Deleted

## 2011-01-21 NOTE — Telephone Encounter (Signed)
Got a call from Angie at [facility] in [place]. They have did pt's DOT physical and she states that she needs a letter faxed to them stating that the pt is under your care as his primary and he is safe to drive a vehicle. I told her that since you did not do the DOT physical I was not sure that you could provide this. Please Advise  Fax [phone]

## 2011-01-22 ENCOUNTER — Telehealth: Payer: Self-pay | Admitting: *Deleted

## 2011-01-22 NOTE — Telephone Encounter (Signed)
Spoke w/pt and explained that MD did not do DOT physicals and he could not give letter of clearance to [facility]. I advised pt to continued to f/u with [facility].

## 2011-01-22 NOTE — Telephone Encounter (Signed)
Received a call from Angie at [facility] wanting a letter sent to them that states pt is safe to operate vehicle on the [place]. I went back and spoke to Dr. Yetta Barre. He stated that he could not provide a letter stating that this pt is safe to drive due to recent opiate positive result and recreational drug use. I informed Angie at [facility] that we could not grant the request due to recent lab results.

## 2011-01-25 ENCOUNTER — Other Ambulatory Visit: Payer: Self-pay | Admitting: Internal Medicine

## 2011-03-08 ENCOUNTER — Other Ambulatory Visit: Payer: Self-pay | Admitting: Internal Medicine

## 2011-03-21 ENCOUNTER — Ambulatory Visit: Payer: Self-pay | Admitting: Gastroenterology

## 2011-03-28 ENCOUNTER — Ambulatory Visit: Payer: Self-pay | Admitting: Internal Medicine

## 2011-04-01 ENCOUNTER — Encounter: Payer: Self-pay | Admitting: Internal Medicine

## 2011-04-01 ENCOUNTER — Other Ambulatory Visit (INDEPENDENT_AMBULATORY_CARE_PROVIDER_SITE_OTHER): Payer: Self-pay

## 2011-04-01 ENCOUNTER — Ambulatory Visit (INDEPENDENT_AMBULATORY_CARE_PROVIDER_SITE_OTHER): Payer: Self-pay | Admitting: Internal Medicine

## 2011-04-01 DIAGNOSIS — E119 Type 2 diabetes mellitus without complications: Secondary | ICD-10-CM

## 2011-04-01 DIAGNOSIS — E785 Hyperlipidemia, unspecified: Secondary | ICD-10-CM

## 2011-04-01 DIAGNOSIS — I10 Essential (primary) hypertension: Secondary | ICD-10-CM

## 2011-04-01 DIAGNOSIS — J449 Chronic obstructive pulmonary disease, unspecified: Secondary | ICD-10-CM

## 2011-04-01 DIAGNOSIS — R3129 Other microscopic hematuria: Secondary | ICD-10-CM

## 2011-04-01 DIAGNOSIS — M545 Low back pain, unspecified: Secondary | ICD-10-CM

## 2011-04-01 DIAGNOSIS — B3749 Other urogenital candidiasis: Secondary | ICD-10-CM

## 2011-04-01 DIAGNOSIS — M542 Cervicalgia: Secondary | ICD-10-CM

## 2011-04-01 DIAGNOSIS — B3742 Candidal balanitis: Secondary | ICD-10-CM

## 2011-04-01 DIAGNOSIS — G894 Chronic pain syndrome: Secondary | ICD-10-CM

## 2011-04-01 DIAGNOSIS — B1710 Acute hepatitis C without hepatic coma: Secondary | ICD-10-CM

## 2011-04-01 DIAGNOSIS — J4489 Other specified chronic obstructive pulmonary disease: Secondary | ICD-10-CM

## 2011-04-01 LAB — URINALYSIS, ROUTINE W REFLEX MICROSCOPIC
Bilirubin Urine: NEGATIVE
Ketones, ur: NEGATIVE
Leukocytes, UA: NEGATIVE
Nitrite: NEGATIVE
Urobilinogen, UA: 0.2 (ref 0.0–1.0)

## 2011-04-01 LAB — COMPREHENSIVE METABOLIC PANEL
ALT: 52 U/L (ref 0–53)
Albumin: 3.1 g/dL — ABNORMAL LOW (ref 3.5–5.2)
CO2: 25 mEq/L (ref 19–32)
Calcium: 9 mg/dL (ref 8.4–10.5)
Chloride: 94 mEq/L — ABNORMAL LOW (ref 96–112)
GFR: 58.12 mL/min — ABNORMAL LOW (ref >60.00)
Glucose, Bld: 449 mg/dL — ABNORMAL HIGH (ref 70–99)
Sodium: 128 mEq/L — ABNORMAL LOW (ref 135–145)
Total Bilirubin: 0.6 mg/dL (ref 0.3–1.2)
Total Protein: 6.2 g/dL (ref 6.0–8.3)

## 2011-04-01 LAB — HEMOGLOBIN A1C: Hgb A1c MFr Bld: 9.9 % — ABNORMAL HIGH (ref 4.6–6.5)

## 2011-04-01 MED ORDER — CLOTRIMAZOLE-BETAMETHASONE 1-0.05 % EX CREA: TOPICAL_CREAM | CUTANEOUS | Status: AC

## 2011-04-01 MED ORDER — HYDROCODONE-ACETAMINOPHEN 10-500 MG PO TABS: 1.0000 | ORAL_TABLET | Freq: Four times a day (QID) | ORAL | Status: DC | PRN

## 2011-04-01 MED ORDER — OLMESARTAN-AMLODIPINE-HCTZ 40-5-12.5 MG PO TABS: 1.0000 | ORAL_TABLET | Freq: Every day | ORAL | Status: DC

## 2011-04-01 NOTE — Assessment & Plan Note (Signed)
Continue lortab prn

## 2011-04-01 NOTE — Assessment & Plan Note (Signed)
Recheck his UA today

## 2011-04-01 NOTE — Assessment & Plan Note (Signed)
I will check his A1C today and will monitor his renal function 

## 2011-04-01 NOTE — Assessment & Plan Note (Signed)
Start lotrisone cream

## 2011-04-01 NOTE — Progress Notes (Signed)
Subjective:    Patient ID: Alvin Figueroa, male    DOB: [birth year], 45 y.o.   MRN: [id number]  Rash This is a new problem. The current episode started 1 to 4 weeks ago. The problem is unchanged. Pain location: tip of penis. The rash is characterized by itchiness. He was exposed to nothing. Pertinent negatives include no anorexia, congestion, cough, diarrhea, eye pain, facial edema, fatigue, fever, joint pain, nail changes, rhinorrhea, shortness of breath, sore throat or vomiting. Past treatments include nothing.  Hypertension This is a chronic problem. The current episode started more than 1 year ago. The problem has been gradually worsening since onset. The problem is uncontrolled. Associated symptoms include neck pain (chronic, unchanged pain). Pertinent negatives include no anxiety, blurred vision, chest pain, headaches, malaise/fatigue, orthopnea, palpitations, peripheral edema, PND, shortness of breath or sweats. Past treatments include ACE inhibitors. The current treatment provides no improvement. There are no compliance problems.   Diabetes He presents for his follow-up diabetic visit. He has type 2 diabetes mellitus. His disease course has been fluctuating. There are no hypoglycemic associated symptoms. Pertinent negatives for hypoglycemia include no confusion, dizziness, headaches, nervousness/anxiousness, pallor, seizures, speech difficulty, sweats or tremors. Pertinent negatives for diabetes include no blurred vision, no chest pain, no fatigue, no foot paresthesias, no foot ulcerations, no polydipsia, no polyphagia, no polyuria, no visual change, no weakness and no weight loss. There are no hypoglycemic complications. Symptoms are stable. There are no diabetic complications. Current diabetic treatment includes oral agent (triple therapy) and intensive insulin program. He is compliant with treatment all of the time. His weight is stable. He is following a generally healthy diet. Meal planning  includes avoidance of concentrated sweets. He participates in exercise intermittently. There is no change in his home blood glucose trend. His breakfast blood glucose range is generally 130-140 mg/dl. His lunch blood glucose range is generally 130-140 mg/dl. His dinner blood glucose range is generally 130-140 mg/dl. His highest blood glucose is 130-140 mg/dl. His overall blood glucose range is 130-140 mg/dl. An ACE inhibitor/angiotensin II receptor blocker is being taken.      Review of Systems  Constitutional: Negative for fever, chills, weight loss, malaise/fatigue, diaphoresis, activity change, appetite change, fatigue and unexpected weight change.  HENT: Positive for neck pain (chronic, unchanged pain). Negative for hearing loss, congestion, sore throat, facial swelling, rhinorrhea, drooling, mouth sores, trouble swallowing, neck stiffness and voice change.   Eyes: Negative for blurred vision, photophobia, pain, discharge, redness, itching and visual disturbance.  Respiratory: Negative for cough and shortness of breath.   Cardiovascular: Negative for chest pain, palpitations, orthopnea and PND.  Gastrointestinal: Negative for nausea, vomiting, abdominal pain, diarrhea, blood in stool, abdominal distention and anorexia.  Genitourinary: Positive for genital sores. Negative for dysuria, urgency, polyuria, frequency, hematuria, flank pain, decreased urine volume, discharge, penile swelling, scrotal swelling, enuresis, difficulty urinating, penile pain and testicular pain.  Musculoskeletal: Positive for back pain (chronic, unchanged low back pain). Negative for myalgias, joint pain, joint swelling, arthralgias and gait problem.  Skin: Positive for rash. Negative for nail changes, color change, pallor and wound.  Neurological: Negative for dizziness, tremors, seizures, syncope, facial asymmetry, speech difficulty, weakness, light-headedness, numbness and headaches.  Hematological: Negative for  polydipsia, polyphagia and adenopathy. Does not bruise/bleed easily.  Psychiatric/Behavioral: Negative for suicidal ideas, hallucinations, behavioral problems, confusion, sleep disturbance, self-injury, dysphoric mood, decreased concentration and agitation. The patient is not nervous/anxious and is not hyperactive.        Objective:  Physical Exam  Vitals reviewed. Constitutional: He is oriented to person, place, and time. He appears well-developed and well-nourished. No distress.  HENT:  Head: Normocephalic and atraumatic.  Right Ear: External ear normal.  Left Ear: External ear normal.  Nose: Nose normal.  Mouth/Throat: Oropharynx is clear and moist. No oropharyngeal exudate.  Eyes: Conjunctivae and EOM are normal. Pupils are equal, round, and reactive to light. Right eye exhibits no discharge. Left eye exhibits no discharge. No scleral icterus.  Neck: Normal range of motion. Neck supple. No JVD present. No tracheal deviation present. No thyromegaly present.  Cardiovascular: Normal rate, regular rhythm, normal heart sounds and intact distal pulses.  Exam reveals no gallop and no friction rub.   No murmur heard. Pulmonary/Chest: Effort normal. No stridor. No respiratory distress. He has no decreased breath sounds. He has no wheezes. He has rhonchi in the right middle field and the left middle field. He has no rales. He exhibits no tenderness.  Abdominal: Soft. Bowel sounds are normal. He exhibits no distension. There is no tenderness. There is no rebound and no guarding. Hernia confirmed negative in the right inguinal area and confirmed negative in the left inguinal area.  Genitourinary: Testes normal.    Right testis shows no mass and no tenderness. Right testis is descended. Cremasteric reflex is not absent on the right side. Left testis shows no mass, no swelling and no tenderness. Left testis is descended. Cremasteric reflex is not absent on the left side. Circumcised. Penile erythema  present. No penile tenderness. No discharge found.       In a circumferential ring at the very bottom of the glans there is a line of erythema, scale, fissuring but no vesicles, exudate, streaking, pustules, or warmth  Musculoskeletal: Normal range of motion. He exhibits no edema and no tenderness.  Lymphadenopathy:    He has no cervical adenopathy.       Right: No inguinal adenopathy present.       Left: No inguinal adenopathy present.  Neurological: He is alert and oriented to person, place, and time. He has normal reflexes. He displays normal reflexes. No cranial nerve deficit. He exhibits normal muscle tone. Coordination normal.  Skin: Skin is warm and dry. No rash noted. He is not diaphoretic. No erythema. No pallor.  Psychiatric: He has a normal mood and affect. His behavior is normal. Judgment and thought content normal.      Lab Results  Component Value Date   WBC 6.7 10/17/2010   HGB 13.4 10/17/2010   HCT 37.4* 10/17/2010   PLT 181.0 10/17/2010   ALT 36 10/17/2010   AST 26 10/17/2010   NA 136 10/17/2010   K 4.1 10/17/2010   CL 103 10/17/2010   CREATININE 1.2 10/17/2010   BUN 20 10/17/2010   CO2 27 10/17/2010   TSH 1.77 08/08/2010   HGBA1C 7.6* 10/17/2010      Assessment & Plan:

## 2011-04-01 NOTE — Assessment & Plan Note (Addendum)
He is doing well on spiriva, I asked him to quit smoking

## 2011-04-01 NOTE — Assessment & Plan Note (Signed)
HIs BP is not well controlled, so I have stopped the ACEI and have asked him to start tribenzor

## 2011-04-01 NOTE — Patient Instructions (Signed)
Diabetes, Type 2 Diabetes is a lasting (chronic) disease. In type 2 diabetes, the pancreas does not make enough insulin (a hormone), and the body does not respond normally to the insulin that is made. This type of diabetes was also previously called adult onset diabetes. About 90% of all those who have diabetes have type 2. It usually occurs after the age of 40 but can occur at any age. CAUSES Unlike type 1 diabetes, which happens because insulin is no longer being made, type 2 diabetes happens because the body is making less insulin and has trouble using the insulin properly. SYMPTOMS  Drinking more than usual.   Urinating more than usual.   Blurred vision.   Dry, itchy skin.   Frequent infection like yeast infections in women.   More tired than usual (fatigue).  TREATMENT  Healthy eating.   Exercise.   Medication, if needed.   Monitoring blood glucose (sugar).   Seeing your caregiver regularly.  HOME CARE INSTRUCTIONS  Check your blood glucose (sugar) at least once daily. More frequent monitoring may be necessary, depending on your medications and on how well your diabetes is controlled. Your caregiver will advise you.   Take your medicine as directed by your caregiver.   Do not smoke.   Make wise food choices. Ask your caregiver for information. Weight loss can improve your diabetes.   Learn about low blood glucose (hypoglycemia) and how to treat it.   Get your eyes checked regularly.   Have a yearly physical exam. Have your blood pressure checked. Get your blood and urine tested.   Wear a pendant or bracelet saying that you have diabetes.   Check your feet every night for sores. Let your caregiver know if you have sores that are not healing.  SEEK MEDICAL CARE IF:  You are having problems keeping your blood glucose at target range.   You feel you might be having problems with your medicines.   You have symptoms of an illness that is not improving after 24  hours.   You have a sore or wound that is not healing.   You notice a change in vision or a new problem with your vision.   You develop a fever of more than 100.5.  Document Released: 08/05/2005 Document Re-Released: 08/27/2009 [employer] Patient Information 2011 [employer].Balanitis Balanitis is an common infection of the head (glans) of the penis. CAUSES Balanitis has multiple causes. Frequently balanitis is the result of poor personal hygiene. Especially if no circumcision has been done. Without adequate washing, many different kinds of germs (viruses, bacteria, and yeast) collect between the foreskin and the glans. This can cause an infection. Lack of air and irritation from a normal secretion called smegma contribute to the cause in uncircumcised males. Other causes include chemical irritation by certain soaps (especially soaps with perfumes). When no circumcision has been done, a frequent cause of poor hygiene is that the tip of the foreskin is tight (phimosis) and cannot be pulled back for adequate washing. Illnesses in other areas of the body can also cause Balanitis. This includes illnesses that cause water retention and swelling, such as:  Heart failure.   Cirrhosis of the liver.   Kidney problems.  Other contributing causes include:  Obesity.   Certain allergies to drugs such as tetracycline and sulfa.   Diabetes.  SYMPTOMS Symptoms may include:  Discharge coming from under the foreskin.   Tenderness.   Itching and inability to get an erection (because of the   pain).   Redness and a rash is frequently seen.   If the problem remains for a while, sores can be seen on the glans and on the foreskin.  If the condition is not treated other complications such as a scar of the opening to the urethra (tube that carries the urine out from the bladder) can occur and block the bladder. This narrowing is called meatal stenosis. Other problems can occur such as:  Infection of  the lymph nodes in the crease of the groin.   Ballooning of the foreskin when voiding (when the foreskin opening has scarred down and been made smaller).   Blockage of the bladder.   Frequent urinary infections occur in children with balanitis.  HOME CARE INSTRUCTIONS  Pull back foreskin to urinate and when washing.   Pull back foreskin when putting medicine on the affected area to prevent the foreskin from swelling and being trapped behind the head.   Keep foreskin and glans clean and dry.   Sitz baths may be helpful.   Use Antibiotics as directed - anti fungus medication (depends on cause).   Pain medication, if needed.   Circumcision (may be recommended).  SEEK IMMEDIATE MEDICAL CARE IF:  The affected area becomes trapped behind the head.   You start a fever.   The swelling increases.  Document Released: 12/22/2008 Document Re-Released: 10/30/2009 [employer] Patient Information 2011 [employer]. 

## 2011-04-01 NOTE — Assessment & Plan Note (Signed)
No changes, continue lortab prn for pain

## 2011-04-01 NOTE — Assessment & Plan Note (Signed)
He is not willing to pursue any treatment of this at this time

## 2011-04-11 ENCOUNTER — Other Ambulatory Visit: Payer: Self-pay | Admitting: Internal Medicine

## 2011-05-24 ENCOUNTER — Ambulatory Visit (INDEPENDENT_AMBULATORY_CARE_PROVIDER_SITE_OTHER): Payer: Self-pay | Admitting: Internal Medicine

## 2011-05-24 ENCOUNTER — Encounter: Payer: Self-pay | Admitting: Internal Medicine

## 2011-05-24 VITALS — BP 130/84 | HR 76 | Temp 98.8°F | Resp 16 | Wt 193.0 lb

## 2011-05-24 DIAGNOSIS — J441 Chronic obstructive pulmonary disease with (acute) exacerbation: Secondary | ICD-10-CM | POA: Insufficient documentation

## 2011-05-24 DIAGNOSIS — J209 Acute bronchitis, unspecified: Secondary | ICD-10-CM

## 2011-05-24 MED ORDER — MOXIFLOXACIN HCL 400 MG PO TABS: 400.0000 mg | ORAL_TABLET | Freq: Every day | ORAL | Status: AC

## 2011-05-24 MED ORDER — PSEUDOEPH-CHLORPHEN-HYDROCOD 60-4-5 MG/5ML PO SOLN: 5.0000 mL | Freq: Four times a day (QID) | ORAL | Status: DC | PRN

## 2011-05-24 NOTE — Assessment & Plan Note (Signed)
Start avelox for the infection and use zutripro for the cough

## 2011-05-24 NOTE — Assessment & Plan Note (Signed)
Restart spiriva and give depo-medrol IM for the exacerbation

## 2011-05-24 NOTE — Progress Notes (Signed)
  Subjective:    Patient ID: Alvin Figueroa, male    DOB: Jan 10, 1966, 45 y.o.   MRN: [id number]  URI  This is a new problem. Episode onset: 4 days. The problem has been unchanged. There has been no fever. Associated symptoms include congestion, coughing (productive of yellow phlegm), rhinorrhea (yellow nasal phlegm), sinus pain and a sore throat. Pertinent negatives include no abdominal pain, chest pain, diarrhea, dysuria, ear pain, headaches, joint pain, joint swelling, nausea, neck pain, plugged ear sensation, rash, sneezing, swollen glands, vomiting or wheezing. He has tried nothing for the symptoms.      Review of Systems  HENT: Positive for congestion, sore throat and rhinorrhea (yellow nasal phlegm). Negative for ear pain, sneezing and neck pain.   Respiratory: Positive for cough (productive of yellow phlegm). Negative for wheezing.   Cardiovascular: Negative for chest pain.  Gastrointestinal: Negative for nausea, vomiting, abdominal pain and diarrhea.  Genitourinary: Negative for dysuria.  Musculoskeletal: Negative for joint pain.  Skin: Negative for rash.  Neurological: Negative for headaches.       Objective:   Physical Exam  Vitals reviewed. Constitutional: He is oriented to person, place, and time. He appears well-developed and well-nourished. No distress.  HENT:  Head: Normocephalic.  Right Ear: External ear normal.  Left Ear: External ear normal.  Nose: Nose normal.  Mouth/Throat: Oropharynx is clear and moist. No oropharyngeal exudate.  Eyes: Conjunctivae are normal. Right eye exhibits no discharge. Left eye exhibits no discharge. No scleral icterus.  Neck: Normal range of motion. Neck supple. No JVD present. No tracheal deviation present. No thyromegaly present.  Cardiovascular: Normal rate, regular rhythm, normal heart sounds and intact distal pulses.  Exam reveals no gallop and no friction rub.   No murmur heard. Pulmonary/Chest: Effort normal. No accessory  muscle usage or stridor. Not tachypneic. No respiratory distress. He has no decreased breath sounds. He has no wheezes. He has rhonchi in the right middle field and the left middle field. He has no rales.  Abdominal: Soft. Bowel sounds are normal. He exhibits no distension and no mass. There is no tenderness. There is no rebound and no guarding.  Musculoskeletal: Normal range of motion. He exhibits no edema and no tenderness.  Lymphadenopathy:    He has no cervical adenopathy.  Neurological: He is oriented to person, place, and time. He displays normal reflexes. No cranial nerve deficit. He exhibits normal muscle tone. Coordination normal.  Skin: Skin is warm and dry. No rash noted. He is not diaphoretic. No erythema. No pallor.  Psychiatric: He has a normal mood and affect. His behavior is normal. Judgment and thought content normal.          Lab Results  Component Value Date   WBC 6.7 10/17/2010   HGB 13.4 10/17/2010   HCT 37.4* 10/17/2010   PLT 181.0 10/17/2010   GLUCOSE 449* 04/01/2011   ALT 52 04/01/2011   AST 34 04/01/2011   NA 128* 04/01/2011   K 4.2 04/01/2011   CL 94* 04/01/2011   CREATININE 1.4 04/01/2011   BUN 25* 04/01/2011   CO2 25 04/01/2011   TSH 1.77 08/08/2010   HGBA1C 9.9* 04/01/2011   Assessment & Plan:

## 2011-05-24 NOTE — Patient Instructions (Signed)
Acute Bronchitis You have acute bronchitis. This means you have a chest cold. The airways in your lungs are inflamed (red and sore). Acute means it is sudden onset. Bronchitis is most often caused by a virus. In smokers, people with chronic lung problems, and elderly patients, treatment with antibiotics for bacterial infection may be needed. Exposure to cigarette smoke or irritating chemicals will make bronchitis worse. Allergies and asthma can also make bronchitis worse. Repeated episodes of bronchitis may cause long standing lung problems. Acute bronchitis is usually treated with rest, fluids, and medicines for relief of fever or cough. Bronchodilator medicines from metered inhalers or a nebulizer may be used to help open up the small airways. This reduces shortness of breath and helps control cough. Antibiotics can be prescribed if you are more seriously ill or at risk. A cool air vaporizer may help thin bronchial secretions and make it easier to clear your chest. Increased fluids may also help. You must avoid smoking, even second hand exposure. If you are a cigarette smoker, consider using nicotine gum or skin patches to help control withdrawal symptoms. Recovery from bronchitis is often slow, but you should start feeling better after 2-3 days. Cough from bronchitis frequently lasts for 3-4 weeks.  SEEK IMMEDIATE MEDICAL CARE IF YOU DEVELOP:  Increased fever, chills, or chest pain.   Severe shortness of breath or bloody sputum.   Dehydration, fainting, repeated vomiting, severe headache.   No improvement after one week of proper treatment.  MAKE SURE YOU:   Understand these instructions.   Will watch your condition.   Will get help right away if you are not doing well or get worse.  Document Released: 09/12/2004 Document Re-Released: 07/18/2008 [employer] Patient Information 2011 [employer]. 

## 2011-06-17 ENCOUNTER — Other Ambulatory Visit: Payer: Self-pay | Admitting: Internal Medicine

## 2011-06-17 NOTE — Telephone Encounter (Signed)
Request for Hydrocodone-APAP 10-500 [seems too early-last refill 04/01/11 #120x4]

## 2011-06-18 ENCOUNTER — Telehealth: Payer: Self-pay

## 2011-06-18 NOTE — Telephone Encounter (Signed)
FYI.Marland KitchenMarland KitchenReceived fax from pharmacy asking if ok for early refill. Per pharmacy, pt just got #120 on 05/23/11 for a 30day supply. Request has been denied and pharmacy notified

## 2011-06-25 ENCOUNTER — Telehealth: Payer: Self-pay | Admitting: *Deleted

## 2011-06-25 ENCOUNTER — Other Ambulatory Visit: Payer: Self-pay | Admitting: *Deleted

## 2011-06-25 DIAGNOSIS — B3742 Candidal balanitis: Secondary | ICD-10-CM

## 2011-06-25 DIAGNOSIS — E119 Type 2 diabetes mellitus without complications: Secondary | ICD-10-CM

## 2011-06-25 DIAGNOSIS — I10 Essential (primary) hypertension: Secondary | ICD-10-CM

## 2011-06-25 MED ORDER — OLMESARTAN-AMLODIPINE-HCTZ 40-5-12.5 MG PO TABS: 1.0000 | ORAL_TABLET | Freq: Every day | ORAL | Status: DC

## 2011-06-25 NOTE — Telephone Encounter (Signed)
Request for BP medication [Tribenzor 40-5-12.5] Samples. Patient informed samples ready for p/u Mon-Fri 8am-5pm

## 2011-07-02 ENCOUNTER — Telehealth: Payer: Self-pay

## 2011-07-02 NOTE — Telephone Encounter (Signed)
Patient wife called requesting samples of janumet. She states that she will pick up today//samples placed up front

## 2011-07-05 ENCOUNTER — Ambulatory Visit: Payer: Self-pay | Admitting: Internal Medicine

## 2011-07-17 ENCOUNTER — Ambulatory Visit: Payer: Self-pay | Admitting: Internal Medicine

## 2011-07-22 ENCOUNTER — Ambulatory Visit: Payer: Self-pay | Admitting: Internal Medicine

## 2011-07-23 ENCOUNTER — Ambulatory Visit (INDEPENDENT_AMBULATORY_CARE_PROVIDER_SITE_OTHER): Payer: Self-pay | Admitting: Internal Medicine

## 2011-07-23 ENCOUNTER — Other Ambulatory Visit: Payer: Self-pay | Admitting: Internal Medicine

## 2011-07-23 ENCOUNTER — Other Ambulatory Visit (INDEPENDENT_AMBULATORY_CARE_PROVIDER_SITE_OTHER): Payer: Self-pay

## 2011-07-23 ENCOUNTER — Encounter: Payer: Self-pay | Admitting: Internal Medicine

## 2011-07-23 DIAGNOSIS — E119 Type 2 diabetes mellitus without complications: Secondary | ICD-10-CM

## 2011-07-23 DIAGNOSIS — R3129 Other microscopic hematuria: Secondary | ICD-10-CM

## 2011-07-23 DIAGNOSIS — B1710 Acute hepatitis C without hepatic coma: Secondary | ICD-10-CM

## 2011-07-23 DIAGNOSIS — E785 Hyperlipidemia, unspecified: Secondary | ICD-10-CM

## 2011-07-23 DIAGNOSIS — M545 Low back pain, unspecified: Secondary | ICD-10-CM

## 2011-07-23 DIAGNOSIS — G894 Chronic pain syndrome: Secondary | ICD-10-CM

## 2011-07-23 DIAGNOSIS — I10 Essential (primary) hypertension: Secondary | ICD-10-CM

## 2011-07-23 DIAGNOSIS — M542 Cervicalgia: Secondary | ICD-10-CM

## 2011-07-23 LAB — COMPREHENSIVE METABOLIC PANEL
AST: 37 U/L (ref 0–37)
BUN: 21 mg/dL (ref 6–23)
Calcium: 9.5 mg/dL (ref 8.4–10.5)
Chloride: 101 mEq/L (ref 96–112)
Creatinine, Ser: 1.4 mg/dL (ref 0.4–1.5)
GFR: 56.63 mL/min — ABNORMAL LOW (ref >60.00)
Glucose, Bld: 247 mg/dL — ABNORMAL HIGH (ref 70–99)

## 2011-07-23 LAB — CBC WITH DIFFERENTIAL/PLATELET
Basophils Absolute: 0.1 10*3/uL (ref 0.0–0.1)
Basophils Relative: 1.3 % (ref 0.0–3.0)
HCT: 42.6 % (ref 39.0–52.0)
Hemoglobin: 15.3 g/dL (ref 13.0–17.0)
Lymphs Abs: 1.4 10*3/uL (ref 0.7–4.0)
MCHC: 35.9 g/dL (ref 30.0–36.0)
Monocytes Relative: 7.3 % (ref 3.0–12.0)
Neutro Abs: 5.3 10*3/uL (ref 1.4–7.7)
RBC: 4.6 Mil/uL (ref 4.22–5.81)
RDW: 13.4 % (ref 11.5–14.6)

## 2011-07-23 LAB — LDL CHOLESTEROL, DIRECT: Direct LDL: 74.2 mg/dL

## 2011-07-23 LAB — URINALYSIS, ROUTINE W REFLEX MICROSCOPIC
Ketones, ur: NEGATIVE
Specific Gravity, Urine: 1.025 (ref 1.000–1.030)
Total Protein, Urine: >=300
Urine Glucose: >=1000
pH: 7 (ref 5.0–8.0)

## 2011-07-23 LAB — LIPID PANEL: Cholesterol: 238 mg/dL — ABNORMAL HIGH (ref 0–200)

## 2011-07-23 LAB — TSH: TSH: 0.71 u[IU]/mL (ref 0.35–5.50)

## 2011-07-23 LAB — CK: Total CK: 131 U/L (ref 7–232)

## 2011-07-23 MED ORDER — INSULIN GLARGINE 100 UNIT/ML ~~LOC~~ SOLN: 50.0000 [IU] | Freq: Every day | SUBCUTANEOUS | Status: DC

## 2011-07-23 MED ORDER — PRAVASTATIN SODIUM 40 MG PO TABS: 40.0000 mg | ORAL_TABLET | Freq: Every day | ORAL | Status: DC

## 2011-07-23 MED ORDER — GLIPIZIDE 10 MG PO TABS: 20.0000 mg | ORAL_TABLET | Freq: Every day | ORAL | Status: DC

## 2011-07-23 MED ORDER — HYDROCODONE-ACETAMINOPHEN 10-500 MG PO TABS: 1.0000 | ORAL_TABLET | Freq: Four times a day (QID) | ORAL | Status: DC | PRN

## 2011-07-23 NOTE — Progress Notes (Signed)
Subjective:    Patient ID: Alvin Figueroa, male    DOB: 07/18/1966, 45 y.o.   MRN: [id number]  Hyperlipidemia This is a chronic problem. The current episode started more than 1 year ago. The problem is controlled. Recent lipid tests were reviewed and are variable. Exacerbating diseases include diabetes and liver disease. He has no history of chronic renal disease, hypothyroidism, obesity or nephrotic syndrome. Factors aggravating his hyperlipidemia include no known factors. Associated symptoms include myalgias. Pertinent negatives include no chest pain, focal sensory loss, focal weakness, leg pain or shortness of breath. Current antihyperlipidemic treatment includes statins. The current treatment provides moderate improvement of lipids. There are no compliance problems.   Hypertension This is a chronic problem. The current episode started more than 1 year ago. The problem is unchanged. The problem is controlled. Associated symptoms include neck pain (chronic, unchanged). Pertinent negatives include no anxiety, blurred vision, chest pain, headaches, malaise/fatigue, orthopnea, palpitations, peripheral edema, PND, shortness of breath or sweats. Past treatments include angiotensin blockers, diuretics and calcium channel blockers. The current treatment provides moderate improvement. Compliance problems include medication cost.  There is no history of chronic renal disease.  Diabetes He presents for his follow-up diabetic visit. He has type 2 diabetes mellitus. His disease course has been stable. There are no hypoglycemic associated symptoms. Pertinent negatives for hypoglycemia include no dizziness, headaches, pallor, seizures, speech difficulty, sweats or tremors. Associated symptoms include fatigue. Pertinent negatives for diabetes include no blurred vision, no chest pain, no foot paresthesias, no foot ulcerations, no polydipsia, no polyphagia, no polyuria, no visual change, no weakness and no weight  loss. There are no hypoglycemic complications. Symptoms are stable. Diabetic complications include impotence. Current diabetic treatment includes oral agent (triple therapy). He is compliant with treatment all of the time. His weight is stable. He is following a generally unhealthy diet. When asked about meal planning, he reported none. He participates in exercise intermittently. There is no change in his home blood glucose trend. An ACE inhibitor/angiotensin II receptor blocker is being taken. He does not see a podiatrist.Eye exam is not current.      Review of Systems  Constitutional: Positive for fatigue. Negative for fever, chills, weight loss, malaise/fatigue, diaphoresis, activity change, appetite change and unexpected weight change.  HENT: Positive for neck pain (chronic, unchanged) and neck stiffness. Negative for congestion, sore throat, facial swelling, rhinorrhea, trouble swallowing, voice change and postnasal drip.   Eyes: Negative.  Negative for blurred vision.  Respiratory: Negative for shortness of breath.   Cardiovascular: Negative for chest pain, palpitations, orthopnea, leg swelling and PND.  Gastrointestinal: Negative for nausea, vomiting, abdominal pain, diarrhea, constipation, blood in stool and abdominal distention.  Genitourinary: Positive for impotence. Negative for dysuria, urgency, polyuria, frequency, hematuria, flank pain, decreased urine volume, enuresis and difficulty urinating.  Musculoskeletal: Positive for myalgias and arthralgias (large joints). Negative for back pain, joint swelling and gait problem.  Skin: Negative for color change, pallor, rash and wound.  Neurological: Negative for dizziness, tremors, focal weakness, seizures, syncope, facial asymmetry, speech difficulty, weakness, light-headedness, numbness and headaches.  Hematological: Negative for polydipsia, polyphagia and adenopathy. Does not bruise/bleed easily.  Psychiatric/Behavioral: Negative.         Objective:   Physical Exam  Vitals reviewed. Constitutional: He is oriented to person, place, and time. He appears well-developed and well-nourished. No distress.  HENT:  Head: Normocephalic and atraumatic.  Mouth/Throat: Oropharynx is clear and moist. No oropharyngeal exudate.  Eyes: Conjunctivae are normal. Right eye exhibits  no discharge. Left eye exhibits no discharge. No scleral icterus.  Neck: Normal range of motion. Neck supple. No JVD present. No tracheal deviation present. No thyromegaly present.  Cardiovascular: Normal rate, regular rhythm, normal heart sounds and intact distal pulses.  Exam reveals no gallop and no friction rub.   No murmur heard. Pulmonary/Chest: Effort normal and breath sounds normal. No stridor. No respiratory distress. He has no wheezes. He has no rales. He exhibits no tenderness.  Abdominal: Soft. Bowel sounds are normal. He exhibits no distension and no mass. There is no tenderness. There is no rebound and no guarding.  Musculoskeletal: Normal range of motion. He exhibits no edema and no tenderness.  Lymphadenopathy:    He has no cervical adenopathy.  Neurological: He is oriented to person, place, and time. He displays normal reflexes. He exhibits normal muscle tone. Coordination normal.  Skin: Skin is warm and dry. No rash noted. He is not diaphoretic. No erythema. No pallor.  Psychiatric: He has a normal mood and affect. His behavior is normal. Judgment and thought content normal.      Lab Results  Component Value Date   WBC 7.5 07/23/2011   HGB 15.3 07/23/2011   HCT 42.6 07/23/2011   PLT 225.0 07/23/2011   GLUCOSE 247* 07/23/2011   CHOL 238* 07/23/2011   TRIG 696.0* 07/23/2011   HDL 46.10 07/23/2011   LDLDIRECT 74.2 07/23/2011   ALT 44 07/23/2011   AST 37 07/23/2011   NA 136 07/23/2011   K 4.4 07/23/2011   CL 101 07/23/2011   CREATININE 1.4 07/23/2011   BUN 21 07/23/2011   CO2 25 07/23/2011   TSH 0.71 07/23/2011   HGBA1C 8.1* 07/23/2011        Assessment & Plan:

## 2011-07-23 NOTE — Patient Instructions (Signed)

## 2011-07-24 ENCOUNTER — Encounter: Payer: Self-pay | Admitting: Internal Medicine

## 2011-07-24 LAB — AFP TUMOR MARKER: AFP-Tumor Marker: 1.6 ng/mL (ref 0.0–8.0)

## 2011-07-24 NOTE — Assessment & Plan Note (Signed)
He has been out of BP meds for several days so I gave him samples today

## 2011-07-24 NOTE — Assessment & Plan Note (Signed)
I will check his a1c today and will monitor his renal function 

## 2011-07-24 NOTE — Assessment & Plan Note (Signed)
I will check his FLP and LFT's today

## 2011-07-24 NOTE — Assessment & Plan Note (Signed)
He is due for an AFP screening and it looks like his abd u/s was not done so I will reorder that, he does not to see a liver specialist

## 2011-07-24 NOTE — Assessment & Plan Note (Signed)
Continue current meds 

## 2011-08-23 ENCOUNTER — Telehealth: Payer: Self-pay | Admitting: *Deleted

## 2011-08-23 ENCOUNTER — Encounter: Payer: Self-pay | Admitting: Internal Medicine

## 2011-08-23 ENCOUNTER — Ambulatory Visit (INDEPENDENT_AMBULATORY_CARE_PROVIDER_SITE_OTHER): Payer: Self-pay | Admitting: Internal Medicine

## 2011-08-23 VITALS — BP 160/100 | HR 99 | Temp 98.3°F

## 2011-08-23 DIAGNOSIS — H9209 Otalgia, unspecified ear: Secondary | ICD-10-CM

## 2011-08-23 DIAGNOSIS — H7290 Unspecified perforation of tympanic membrane, unspecified ear: Secondary | ICD-10-CM

## 2011-08-23 DIAGNOSIS — R110 Nausea: Secondary | ICD-10-CM

## 2011-08-23 DIAGNOSIS — H6691 Otitis media, unspecified, right ear: Secondary | ICD-10-CM

## 2011-08-23 DIAGNOSIS — H9201 Otalgia, right ear: Secondary | ICD-10-CM

## 2011-08-23 DIAGNOSIS — H6690 Otitis media, unspecified, unspecified ear: Secondary | ICD-10-CM

## 2011-08-23 DIAGNOSIS — H7291 Unspecified perforation of tympanic membrane, right ear: Secondary | ICD-10-CM

## 2011-08-23 MED ORDER — OXYCODONE-ACETAMINOPHEN 10-325 MG PO TABS: 1.0000 | ORAL_TABLET | Freq: Three times a day (TID) | ORAL | Status: DC | PRN

## 2011-08-23 MED ORDER — CIPROFLOXACIN HCL 0.2 % OT SOLN: 0.2000 mL | Freq: Two times a day (BID) | OTIC | Status: AC

## 2011-08-23 MED ORDER — MEPERIDINE HCL 50 MG/ML IJ SOLN
50.0000 mg | Freq: Once | INTRAMUSCULAR | Status: AC
  Administered 2011-08-23: 50 mg via INTRAMUSCULAR

## 2011-08-23 MED ORDER — AMOXICILLIN-POT CLAVULANATE 875-125 MG PO TABS: 1.0000 | ORAL_TABLET | Freq: Two times a day (BID) | ORAL | Status: AC

## 2011-08-23 MED ORDER — PROMETHAZINE HCL 25 MG/ML IJ SOLN
25.0000 mg | Freq: Four times a day (QID) | INTRAMUSCULAR | Status: DC | PRN
  Administered 2011-08-23: 25 mg via INTRAMUSCULAR

## 2011-08-23 MED ORDER — ANTIPYRINE-BENZOCAINE 5.4-1.4 % OT SOLN: 3.0000 [drp] | OTIC | Status: AC | PRN

## 2011-08-23 NOTE — Progress Notes (Signed)
  Subjective:    Patient ID: Alvin Figueroa, male    DOB: [birth year], 46 y.o.   MRN: [id number]  Otalgia  There is pain in the right ear. This is a new problem. The current episode started in the past 7 days. The problem occurs constantly. The problem has been rapidly worsening. There has been no fever. The pain is at a severity of 9/10. The pain is severe. Associated symptoms include ear discharge (clear) and hearing loss. Pertinent negatives include no diarrhea, neck pain, rash, rhinorrhea or sore throat. Cough: chronic. He has tried nothing for the symptoms. The treatment provided no relief. There is no history of a chronic ear infection or hearing loss.   Past Medical History  Diagnosis Date  . COPD (chronic obstructive pulmonary disease)   . Type II or unspecified type diabetes mellitus without mention of complication, not stated as uncontrolled   . Hyperlipidemia   . HTN (hypertension)   . Hepatitis C      Review of Systems  HENT: Positive for hearing loss, ear pain and ear discharge (clear). Negative for sore throat, rhinorrhea and neck pain.   Respiratory: Cough: chronic.   Gastrointestinal: Negative for diarrhea.  Skin: Negative for rash.       Objective:   Physical Exam BP 160/100  Pulse 99  Temp(Src) 98.3 F (36.8 C) (Oral)  SpO2 97% Wt Readings from Last 3 Encounters:  07/23/11 198 lb (89.812 kg)  05/24/11 193 lb (87.544 kg)  04/01/11 198 lb 4 oz (89.926 kg)   Gen: Uncomfortable because of pain, otherwise nontoxic HENT: Clear drainage from right canal.  Debris present within canal. Red and ruptured tympanic membrane - left TM clear. Hearing grossly intact on left side, diminished on right. OP clear Lung: Clear to auscultation Cardiovascular: Regular rate and rhythm, no edema Neuro: Awake alert oriented x4, cranial nerves II through XII symmetrically intact. Normal conversation, speech cognition and follows commands without confusion - balance and gait  normal  Lab Results  Component Value Date   WBC 7.5 07/23/2011   HGB 15.3 07/23/2011   HCT 42.6 07/23/2011   PLT 225.0 07/23/2011   GLUCOSE 247* 07/23/2011   CHOL 238* 07/23/2011   TRIG 696.0* 07/23/2011   HDL 46.10 07/23/2011   LDLDIRECT 74.2 07/23/2011   ALT 44 07/23/2011   AST 37 07/23/2011   NA 136 07/23/2011   K 4.4 07/23/2011   CL 101 07/23/2011   CREATININE 1.4 07/23/2011   BUN 21 07/23/2011   CO2 25 07/23/2011   TSH 0.71 07/23/2011   HGBA1C 8.1* 07/23/2011       Assessment & Plan:  Acute otitis media, right Severe otalgia, right Ruptured tympanic membrane, right  IM analgesia today: Demerol/Phenergan Topical analgesia and antibiotics Systemic analgesia and antibiotics Offered referral to ENT which patient declines due to financial concerns - self-pay Recommend followup with primary care physician next week, urgent care sooner if problems

## 2011-08-23 NOTE — Telephone Encounter (Signed)
Received fax stating we do not have the single use, can we give the patient a bottle of ciprofloxacin?Marland Kitchen..08/23/11@11 :42am/LMB

## 2011-08-23 NOTE — Telephone Encounter (Signed)
Faxed request back to pharmacy with md response...08/23/11@3 :06pm/LMB

## 2011-08-23 NOTE — Telephone Encounter (Signed)
yes

## 2011-08-23 NOTE — Patient Instructions (Signed)
It was good to see you today. Shot for pain given to you today - Demerol plus Phenergan Prescriptions for: Augmentin antibiotics twice a day for 10 days, Cipro drops antibiotics twice a day for 7 days - also Percocet for pain as needed and ear drops for pain as needed Your prescription(s) have been submitted to your pharmacy. (Except Percocet prescription which has been given to you) Please take as directed and contact our office if you believe you are having problem(s) with the medication(s). Please schedule followup appointment with Dr. Yetta Barre next week to reevaluate ear problem (rather than followup with ENT specialist), call sooner if problems

## 2011-08-26 ENCOUNTER — Telehealth: Payer: Self-pay | Admitting: Internal Medicine

## 2011-08-26 ENCOUNTER — Encounter: Payer: Self-pay | Admitting: Internal Medicine

## 2011-08-26 ENCOUNTER — Ambulatory Visit (INDEPENDENT_AMBULATORY_CARE_PROVIDER_SITE_OTHER): Payer: Self-pay | Admitting: Internal Medicine

## 2011-08-26 VITALS — BP 150/92 | HR 80 | Temp 98.8°F | Resp 16 | Wt 199.0 lb

## 2011-08-26 DIAGNOSIS — H60399 Other infective otitis externa, unspecified ear: Secondary | ICD-10-CM

## 2011-08-26 DIAGNOSIS — H6090 Unspecified otitis externa, unspecified ear: Secondary | ICD-10-CM | POA: Insufficient documentation

## 2011-08-26 NOTE — Assessment & Plan Note (Signed)
This appears to be resolving on the current regimen so I have asked him to continue the same for now

## 2011-08-26 NOTE — Telephone Encounter (Signed)
yes

## 2011-08-26 NOTE — Patient Instructions (Signed)
Otitis Externa Otitis externa ("swimmer's ear") is a germ (bacterial) or fungal infection of the outer ear canal (from the eardrum to the outside of the ear). Swimming in dirty water may cause swimmer's ear. It also may be caused by moisture in the ear from water remaining after swimming or bathing. Often the first signs of infection may be itching in the ear canal. This may progress to ear canal swelling, redness, and pus drainage, which may be signs of infection. HOME CARE INSTRUCTIONS   Apply the antibiotic drops to the ear canal as prescribed by your doctor.   This can be a very painful medical condition. A strong pain reliever may be prescribed.   Only take over-the-counter or prescription medicines for pain, discomfort, or fever as directed by your caregiver.   If your caregiver has given you a follow-up appointment, it is very important to keep that appointment. Not keeping the appointment could result in a chronic or permanent injury, pain, hearing loss and disability. If there is any problem keeping the appointment, you must call back to this facility for assistance.  PREVENTION   It is important to keep your ear dry. Use the corner of a towel to wick water out of the ear canal after swimming or bathing.   Avoid scratching in your ear. This can damage the ear canal or remove the protective wax lining the canal and make it easier for germs (bacteria) or a fungus to grow.   You may use ear drops made of rubbing alcohol and vinegar after swimming to prevent future "swimmer's ear" infections. Make up a small bottle of equal parts white vinegar and alcohol. Put 3 or 4 drops into each ear after swimming.   Avoid swimming in lakes, polluted water, or poorly chlorinated pools.  SEEK MEDICAL CARE IF:   An oral temperature above 102 F (38.9 C) develops.   Your ear is still painful after 3 days and shows signs of getting worse (redness, swelling, pain, or pus).  MAKE SURE YOU:   Understand  these instructions.   Will watch your condition.   Will get help right away if you are not doing well or get worse.  Document Released: 08/05/2005 Document Revised: 04/17/2011 Document Reviewed: 03/11/2008 [employer] Patient Information 2012 [employer]. 

## 2011-08-26 NOTE — Telephone Encounter (Signed)
The pt called wanting a same day apt for an ear infection with ear pain.  He stated he is having drainage. Please advise if the can be worked in.   Thanks!

## 2011-08-26 NOTE — Progress Notes (Signed)
Subjective:    Patient ID: Alvin Figueroa, male    DOB: September 23, 1965, 46 y.o.   MRN: [id number]  HPI He returns for a recheck on his right ear, he was seen three days ago and started on augmentin and cipro otic for OE and today he tells me that he is feeling better but he remains concerned that there has been some drainage from his right ear.  Review of Systems  Constitutional: Negative for fever, chills, diaphoresis, activity change, appetite change, fatigue and unexpected weight change.  HENT: Positive for hearing loss, ear pain and tinnitus. Negative for nosebleeds, congestion, sore throat, facial swelling, rhinorrhea, sneezing, drooling, mouth sores, trouble swallowing, neck pain, neck stiffness, dental problem, postnasal drip, sinus pressure and ear discharge.   Eyes: Negative.   Respiratory: Negative for cough, chest tightness, shortness of breath, wheezing and stridor.   Cardiovascular: Negative for chest pain, palpitations and leg swelling.  Gastrointestinal: Negative.   Genitourinary: Negative.   Musculoskeletal: Negative.   Skin: Negative.   Neurological: Negative.   Hematological: Negative for adenopathy. Does not bruise/bleed easily.  Psychiatric/Behavioral: Negative.        Objective:   Physical Exam  Vitals reviewed. Constitutional: He is oriented to person, place, and time. He appears well-developed and well-nourished. No distress.  HENT:  Head: Normocephalic and atraumatic.  Right Ear: Hearing and tympanic membrane normal. No lacerations. There is drainage (scant amount of yellow fliud in the EAC) and swelling (EAC is mildly swollen). No foreign bodies. No mastoid tenderness. Tympanic membrane is not injected, not scarred, not perforated, not erythematous, not retracted and not bulging. Tympanic membrane mobility is normal. No middle ear effusion. No hemotympanum. No decreased hearing is noted.  Left Ear: Hearing, tympanic membrane, external ear and ear canal normal.    Mouth/Throat: Oropharynx is clear and moist. No oropharyngeal exudate.  Eyes: Conjunctivae are normal. Right eye exhibits no discharge. Left eye exhibits no discharge. No scleral icterus.  Neck: Normal range of motion. Neck supple. No JVD present. No tracheal deviation present. No thyromegaly present.  Cardiovascular: Normal rate, regular rhythm, normal heart sounds and intact distal pulses.  Exam reveals no gallop and no friction rub.   No murmur heard. Pulmonary/Chest: Effort normal and breath sounds normal. No stridor. No respiratory distress. He has no wheezes. He has no rales. He exhibits no tenderness.  Abdominal: Soft. Bowel sounds are normal. He exhibits no distension and no mass. There is no tenderness. There is no rebound and no guarding.  Musculoskeletal: Normal range of motion. He exhibits no edema and no tenderness.  Lymphadenopathy:    He has no cervical adenopathy.  Neurological: He is oriented to person, place, and time.  Skin: Skin is warm and dry. No rash noted. He is not diaphoretic. No erythema. No pallor.  Psychiatric: He has a normal mood and affect. His behavior is normal. Judgment and thought content normal.      Lab Results  Component Value Date   WBC 7.5 07/23/2011   HGB 15.3 07/23/2011   HCT 42.6 07/23/2011   PLT 225.0 07/23/2011   GLUCOSE 247* 07/23/2011   CHOL 238* 07/23/2011   TRIG 696.0* 07/23/2011   HDL 46.10 07/23/2011   LDLDIRECT 74.2 07/23/2011   ALT 44 07/23/2011   AST 37 07/23/2011   NA 136 07/23/2011   K 4.4 07/23/2011   CL 101 07/23/2011   CREATININE 1.4 07/23/2011   BUN 21 07/23/2011   CO2 25 07/23/2011   TSH 0.71 07/23/2011  HGBA1C 8.1* 07/23/2011      Assessment & Plan:

## 2011-08-28 ENCOUNTER — Other Ambulatory Visit: Payer: Self-pay | Admitting: Internal Medicine

## 2011-09-12 ENCOUNTER — Telehealth: Payer: Self-pay

## 2011-09-12 NOTE — Telephone Encounter (Signed)
FYI.Marland KitchenMarland KitchenPharmacy called wanting to inform MD that pt received a rx for hydrocodone from his dentist after informing their office he was out of meds. Per pharmacist, pt last filled rx from our office on 08/19/11 and tried to refill early on 09/08/11. She takes that its hard to track refill abuse due to pt being self pay. At the end of conversation she mentioned that dentist office has since c/x the remaining refills

## 2011-09-27 ENCOUNTER — Ambulatory Visit (INDEPENDENT_AMBULATORY_CARE_PROVIDER_SITE_OTHER): Payer: Self-pay | Admitting: Internal Medicine

## 2011-09-27 ENCOUNTER — Encounter: Payer: Self-pay | Admitting: Internal Medicine

## 2011-09-27 DIAGNOSIS — M542 Cervicalgia: Secondary | ICD-10-CM

## 2011-09-27 DIAGNOSIS — M545 Low back pain: Secondary | ICD-10-CM

## 2011-09-27 DIAGNOSIS — F17200 Nicotine dependence, unspecified, uncomplicated: Secondary | ICD-10-CM

## 2011-09-27 DIAGNOSIS — I10 Essential (primary) hypertension: Secondary | ICD-10-CM

## 2011-09-27 DIAGNOSIS — J441 Chronic obstructive pulmonary disease with (acute) exacerbation: Secondary | ICD-10-CM

## 2011-09-27 DIAGNOSIS — G894 Chronic pain syndrome: Secondary | ICD-10-CM

## 2011-09-27 DIAGNOSIS — J449 Chronic obstructive pulmonary disease, unspecified: Secondary | ICD-10-CM

## 2011-09-27 MED ORDER — MOMETASONE FURO-FORMOTEROL FUM 100-5 MCG/ACT IN AERO: 2.0000 | INHALATION_SPRAY | Freq: Two times a day (BID) | RESPIRATORY_TRACT | Status: AC

## 2011-09-27 MED ORDER — METHYLPREDNISOLONE ACETATE 80 MG/ML IJ SUSP
120.0000 mg | Freq: Once | INTRAMUSCULAR | Status: AC
  Administered 2011-09-27: 120 mg via INTRAMUSCULAR

## 2011-09-27 MED ORDER — HYDROCODONE-ACETAMINOPHEN 10-500 MG PO TABS: 1.0000 | ORAL_TABLET | Freq: Four times a day (QID) | ORAL | Status: DC | PRN

## 2011-09-27 NOTE — Progress Notes (Signed)
Subjective:    Patient ID: Alvin Figueroa, male    DOB: [birth year], 46 y.o.   MRN: [id number]  Cough This is a recurrent problem. The current episode started 1 to 4 weeks ago. The problem has been gradually worsening. The problem occurs every few hours. The cough is non-productive. Associated symptoms include nasal congestion, rhinorrhea, shortness of breath and wheezing. Pertinent negatives include no chest pain, chills, ear congestion, ear pain, fever, headaches, heartburn, hemoptysis, myalgias, postnasal drip, rash, sore throat, sweats or weight loss. The symptoms are aggravated by cold air. Risk factors for lung disease include smoking/tobacco exposure. He has tried ipratropium inhaler and OTC cough suppressant for the symptoms. The treatment provided mild relief. His past medical history is significant for COPD.      Review of Systems  Constitutional: Negative for fever, chills, weight loss, diaphoresis, activity change, appetite change, fatigue and unexpected weight change.  HENT: Positive for congestion, rhinorrhea and neck pain (chronic,unchanged). Negative for hearing loss, ear pain, nosebleeds, sore throat, facial swelling, sneezing, drooling, mouth sores, trouble swallowing, neck stiffness, dental problem, voice change, postnasal drip, sinus pressure, tinnitus and ear discharge.   Eyes: Negative.   Respiratory: Positive for cough, shortness of breath and wheezing. Negative for apnea, hemoptysis, choking, chest tightness and stridor.   Cardiovascular: Negative for chest pain, palpitations and leg swelling.  Gastrointestinal: Negative.  Negative for heartburn.  Genitourinary: Negative.   Musculoskeletal: Positive for back pain (chronic,unchanged). Negative for myalgias, joint swelling, arthralgias and gait problem.  Skin: Negative for color change, pallor, rash and wound.  Neurological: Negative for dizziness, tremors, seizures, syncope, facial asymmetry, speech difficulty,  weakness, light-headedness, numbness and headaches.  Hematological: Negative for adenopathy. Does not bruise/bleed easily.  Psychiatric/Behavioral: Negative.        Objective:   Physical Exam  Vitals reviewed. Constitutional: He is oriented to person, place, and time. He appears well-developed and well-nourished. No distress.  HENT:  Head: Normocephalic and atraumatic.  Mouth/Throat: Oropharynx is clear and moist. No oropharyngeal exudate.  Eyes: Conjunctivae are normal. Right eye exhibits no discharge. Left eye exhibits no discharge. No scleral icterus.  Neck: Normal range of motion. Neck supple. No JVD present. No tracheal deviation present. No thyromegaly present.  Cardiovascular: Normal rate, regular rhythm, normal heart sounds and intact distal pulses.  Exam reveals no gallop and no friction rub.   No murmur heard. Pulmonary/Chest: Effort normal. No accessory muscle usage or stridor. Not tachypneic. No respiratory distress. He has no decreased breath sounds. He has wheezes in the right upper field, the right middle field, the left upper field and the left middle field. He has rhonchi in the right upper field, the right middle field, the left upper field and the left middle field. He has no rales. He exhibits no tenderness.       He was given a jet neb of albuterol 2.5 mg and afterwards his lungs reveal a 50% reduction in the wheezes and rhonchi and he continues to move air really well  Abdominal: Soft. Bowel sounds are normal. He exhibits no distension and no mass. There is no tenderness. There is no rebound and no guarding.  Musculoskeletal: Normal range of motion. He exhibits no edema and no tenderness.  Lymphadenopathy:    He has no cervical adenopathy.  Neurological: He is oriented to person, place, and time.  Skin: Skin is warm and dry. No rash noted. He is not diaphoretic. No erythema. No pallor.  Psychiatric: He has a normal mood and  affect. His behavior is normal. Judgment and  thought content normal.      Lab Results  Component Value Date   WBC 7.5 07/23/2011   HGB 15.3 07/23/2011   HCT 42.6 07/23/2011   PLT 225.0 07/23/2011   GLUCOSE 247* 07/23/2011   CHOL 238* 07/23/2011   TRIG 696.0* 07/23/2011   HDL 46.10 07/23/2011   LDLDIRECT 74.2 07/23/2011   ALT 44 07/23/2011   AST 37 07/23/2011   NA 136 07/23/2011   K 4.4 07/23/2011   CL 101 07/23/2011   CREATININE 1.4 07/23/2011   BUN 21 07/23/2011   CO2 25 07/23/2011   TSH 0.71 07/23/2011   HGBA1C 8.1* 07/23/2011      Assessment & Plan:

## 2011-09-29 ENCOUNTER — Encounter: Payer: Self-pay | Admitting: Internal Medicine

## 2011-09-29 NOTE — Assessment & Plan Note (Signed)
He agrees to quit smoking and will use patches to achieve this

## 2011-09-29 NOTE — Assessment & Plan Note (Signed)
His BP is well controlled 

## 2011-09-29 NOTE — Assessment & Plan Note (Signed)
-  continue current meds  

## 2011-09-29 NOTE — Assessment & Plan Note (Signed)
There is a component of wheezing today so I have added dulera to his regimen and gave him an injection of depo-medrol IM, he agrees to try to quit smoking, he will continue with spiriva

## 2011-09-29 NOTE — Patient Instructions (Signed)

## 2011-09-29 NOTE — Assessment & Plan Note (Signed)
Continue current meds 

## 2011-10-11 ENCOUNTER — Ambulatory Visit: Payer: Self-pay | Admitting: Internal Medicine

## 2011-12-12 ENCOUNTER — Other Ambulatory Visit: Payer: Self-pay | Admitting: Internal Medicine

## 2012-01-10 ENCOUNTER — Telehealth: Payer: Self-pay | Admitting: Internal Medicine

## 2012-01-10 ENCOUNTER — Ambulatory Visit: Payer: Self-pay | Admitting: Endocrinology

## 2012-01-10 NOTE — Telephone Encounter (Signed)
One month only, he is due for f/up 

## 2012-01-10 NOTE — Telephone Encounter (Signed)
Samples ready, patient notified

## 2012-01-10 NOTE — Telephone Encounter (Signed)
Wife requesting janumet samples for pt--will pick up today--

## 2012-01-14 ENCOUNTER — Ambulatory Visit (INDEPENDENT_AMBULATORY_CARE_PROVIDER_SITE_OTHER)
Admission: RE | Admit: 2012-01-14 | Discharge: 2012-01-14 | Disposition: A | Discharge status: Home / Self Care | Payer: Self-pay | Source: Ambulatory Visit | Attending: Internal Medicine | Admitting: Internal Medicine

## 2012-01-14 ENCOUNTER — Ambulatory Visit (INDEPENDENT_AMBULATORY_CARE_PROVIDER_SITE_OTHER): Payer: Self-pay | Admitting: Internal Medicine

## 2012-01-14 ENCOUNTER — Encounter: Payer: Self-pay | Admitting: Internal Medicine

## 2012-01-14 ENCOUNTER — Other Ambulatory Visit (INDEPENDENT_AMBULATORY_CARE_PROVIDER_SITE_OTHER): Payer: Self-pay

## 2012-01-14 VITALS — BP 140/82 | HR 76 | Temp 98.3°F | Resp 16 | Wt 201.0 lb

## 2012-01-14 DIAGNOSIS — J441 Chronic obstructive pulmonary disease with (acute) exacerbation: Secondary | ICD-10-CM

## 2012-01-14 DIAGNOSIS — M542 Cervicalgia: Secondary | ICD-10-CM

## 2012-01-14 DIAGNOSIS — J209 Acute bronchitis, unspecified: Secondary | ICD-10-CM

## 2012-01-14 DIAGNOSIS — R05 Cough: Secondary | ICD-10-CM | POA: Insufficient documentation

## 2012-01-14 DIAGNOSIS — E785 Hyperlipidemia, unspecified: Secondary | ICD-10-CM

## 2012-01-14 DIAGNOSIS — E119 Type 2 diabetes mellitus without complications: Secondary | ICD-10-CM

## 2012-01-14 DIAGNOSIS — R059 Cough, unspecified: Secondary | ICD-10-CM

## 2012-01-14 DIAGNOSIS — E781 Pure hyperglyceridemia: Secondary | ICD-10-CM

## 2012-01-14 DIAGNOSIS — M545 Low back pain, unspecified: Secondary | ICD-10-CM

## 2012-01-14 DIAGNOSIS — G894 Chronic pain syndrome: Secondary | ICD-10-CM

## 2012-01-14 DIAGNOSIS — I10 Essential (primary) hypertension: Secondary | ICD-10-CM

## 2012-01-14 LAB — CBC WITH DIFFERENTIAL/PLATELET
Eosinophils Absolute: 0.2 10*3/uL (ref 0.0–0.7)
Eosinophils Relative: 2.4 % (ref 0.0–5.0)
Lymphocytes Relative: 27.2 % (ref 12.0–46.0)
MCV: 93.1 fl (ref 78.0–100.0)
Monocytes Absolute: 1 10*3/uL (ref 0.1–1.0)
Neutrophils Relative %: 56.4 % (ref 43.0–77.0)
Platelets: 197 10*3/uL (ref 150.0–400.0)
WBC: 7.2 10*3/uL (ref 4.5–10.5)

## 2012-01-14 LAB — COMPREHENSIVE METABOLIC PANEL
ALT: 33 U/L (ref 0–53)
Albumin: 3.4 g/dL — ABNORMAL LOW (ref 3.5–5.2)
CO2: 26 mEq/L (ref 19–32)
Calcium: 9.5 mg/dL (ref 8.4–10.5)
Chloride: 103 mEq/L (ref 96–112)
GFR: 56.51 mL/min — ABNORMAL LOW (ref >60.00)
Potassium: 4.5 mEq/L (ref 3.5–5.1)
Sodium: 137 mEq/L (ref 135–145)
Total Protein: 6.9 g/dL (ref 6.0–8.3)

## 2012-01-14 LAB — LDL CHOLESTEROL, DIRECT: Direct LDL: 47.4 mg/dL

## 2012-01-14 LAB — LIPID PANEL: Total CHOL/HDL Ratio: 4

## 2012-01-14 MED ORDER — MOXIFLOXACIN HCL 400 MG PO TABS: 400.0000 mg | ORAL_TABLET | Freq: Every day | ORAL | Status: AC

## 2012-01-14 MED ORDER — HYDROCODONE-ACETAMINOPHEN 10-500 MG PO TABS: 1.0000 | ORAL_TABLET | Freq: Four times a day (QID) | ORAL | Status: DC | PRN

## 2012-01-14 NOTE — Patient Instructions (Signed)
Diabetes, Type 2 Diabetes is a long-lasting (chronic) disease. In type 2 diabetes, the pancreas does not make enough insulin (a hormone), and the body does not respond normally to the insulin that is made. This type of diabetes was also previously called adult-onset diabetes. It usually occurs after the age of 20, but it can occur at any age.  CAUSES  Type 2 diabetes happens because the pancreasis not making enough insulin or your body has trouble using the insulin that your pancreas does make properly. SYMPTOMS   Drinking more than usual.   Urinating more than usual.   Blurred vision.   Dry, itchy skin.   Frequent infections.   Feeling more tired than usual (fatigue).  DIAGNOSIS The diagnosis of type 2 diabetes is usually made by one of the following tests:  Fasting blood glucose test. You will not eat for at least 8 hours and then take a blood test.   Random blood glucose test. Your blood glucose (sugar) is checked at any time of the day regardless of when you ate.   Oral glucose tolerance test (OGTT). Your blood glucose is measured after you have not eaten (fasted) and then after you drink a glucose containing beverage.  TREATMENT   Healthy eating.   Exercise.   Medicine, if needed.   Monitoring blood glucose.   Seeing your caregiver regularly.  HOME CARE INSTRUCTIONS   Check your blood glucose at least once a day. More frequent monitoring may be necessary, depending on your medicines and on how well your diabetes is controlled. Your caregiver will advise you.   Take your medicine as directed by your caregiver.   Do not smoke.   Make wise food choices. Ask your caregiver for information. Weight loss can improve your diabetes.   Learn about low blood glucose (hypoglycemia) and how to treat it.   Get your eyes checked regularly.   Have a yearly physical exam. Have your blood pressure checked and your blood and urine tested.   Wear a pendant or bracelet saying  that you have diabetes.   Check your feet every night for cuts, sores, blisters, and redness. Let your caregiver know if you have any problems.  SEEK MEDICAL CARE IF:   You have problems keeping your blood glucose in target range.   You have problems with your medicines.   You have symptoms of an illness that do not improve after 24 hours.   You have a sore or wound that is not healing.   You notice a change in vision or a new problem with your vision.   You have a fever.  MAKE SURE YOU:  Understand these instructions.   Will watch your condition.   Will get help right away if you are not doing well or get worse.  Document Released: 08/05/2005 Document Revised: 07/25/2011 Document Reviewed: 01/21/2011 [facility] Patient Information 2012 [place], [place].Acute Bronchitis You have acute bronchitis. This means you have a chest cold. The airways in your lungs are red and sore (inflamed). Acute means it is sudden onset.  CAUSES Bronchitis is most often caused by the same virus that causes a cold. SYMPTOMS   Body aches.   Chest congestion.   Chills.   Cough.   Fever.   Shortness of breath.   Sore throat.  TREATMENT  Acute bronchitis is usually treated with rest, fluids, and medicines for relief of fever or cough. Most symptoms should go away after a few days or a week. Increased fluids may  help thin your secretions and will prevent dehydration. Your caregiver may give you an inhaler to improve your symptoms. The inhaler reduces shortness of breath and helps control cough. You can take over-the-counter pain relievers or cough medicine to decrease coughing, pain, or fever. A cool-air vaporizer may help thin bronchial secretions and make it easier to clear your chest. Antibiotics are usually not needed but can be prescribed if you smoke, are seriously ill, have chronic lung problems, are elderly, or you are at higher risk for developing complications.Allergies and asthma can make  bronchitis worse. Repeated episodes of bronchitis may cause longstanding lung problems. Avoid smoking and secondhand smoke.Exposure to cigarette smoke or irritating chemicals will make bronchitis worse. If you are a cigarette smoker, consider using nicotine gum or skin patches to help control withdrawal symptoms. Quitting smoking will help your lungs heal faster. Recovery from bronchitis is often slow, but you should start feeling better after 2 to 3 days. Cough from bronchitis frequently lasts for 3 to 4 weeks. To prevent another bout of acute bronchitis:  Quit smoking.   Wash your hands frequently to get rid of viruses or use a hand sanitizer.   Avoid other people with cold or virus symptoms.   Try not to touch your hands to your mouth, nose, or eyes.  SEEK IMMEDIATE MEDICAL CARE IF:  You develop increased fever, chills, or chest pain.   You have severe shortness of breath or bloody sputum.   You develop dehydration, fainting, repeated vomiting, or a severe headache.   You have no improvement after 1 week of treatment or you get worse.  MAKE SURE YOU:   Understand these instructions.   Will watch your condition.   Will get help right away if you are not doing well or get worse.  Document Released: 09/12/2004 Document Revised: 07/25/2011 Document Reviewed: 11/28/2010 [facility] Patient Information 2012 [place], [place].

## 2012-01-17 ENCOUNTER — Encounter: Payer: Self-pay | Admitting: Internal Medicine

## 2012-01-17 DIAGNOSIS — E781 Pure hyperglyceridemia: Secondary | ICD-10-CM | POA: Insufficient documentation

## 2012-01-17 MED ORDER — OMEGA-3-ACID ETHYL ESTERS 1 G PO CAPS: 2.0000 g | ORAL_CAPSULE | Freq: Two times a day (BID) | ORAL | Status: DC

## 2012-01-17 NOTE — Assessment & Plan Note (Signed)
I will check his a1c to see if his blood sugars are well controlled 

## 2012-01-17 NOTE — Assessment & Plan Note (Signed)
He will start Avelox for the infection

## 2012-01-17 NOTE — Assessment & Plan Note (Signed)
His trigs are very high so I have asked him to start lovaza 

## 2012-01-17 NOTE — Assessment & Plan Note (Signed)
He is doing well on pravastatin, I will check his labs today

## 2012-01-17 NOTE — Assessment & Plan Note (Signed)
His neck pain has worsened and radiates into his LUE, his plain films look like he has significant disease, he will continue his current meds and I have referred him to pain management for further evaluation

## 2012-01-17 NOTE — Assessment & Plan Note (Signed)
I will check his CXR to look for pna, mass, edema, etc. 

## 2012-01-17 NOTE — Assessment & Plan Note (Signed)
He has a mild exacerbation and he admits that he has not been compliant with his dulera but he agrees today to use the dulera BID as directed every day

## 2012-01-17 NOTE — Assessment & Plan Note (Signed)
His BP is well controlled, I will check his lytes and renal function today 

## 2012-01-17 NOTE — Progress Notes (Signed)
Subjective:    Patient ID: Alvin Figueroa, male    DOB: [birth year], 46 y.o.   MRN: [id number]  Cough This is a new problem. The current episode started 1 to 4 weeks ago. The problem has been gradually worsening. The problem occurs every few hours. The cough is productive of purulent sputum. Associated symptoms include a sore throat and wheezing. Pertinent negatives include no chest pain, chills, ear congestion, ear pain, fever, headaches, heartburn, hemoptysis, myalgias, nasal congestion, postnasal drip, rash, rhinorrhea, shortness of breath, sweats or weight loss. The symptoms are aggravated by nothing. He has tried nothing for the symptoms. His past medical history is significant for COPD.  Hyperlipidemia This is a chronic problem. The current episode started more than 1 year ago. The problem is controlled. Recent lipid tests were reviewed and are variable. He has no history of chronic renal disease, diabetes, hypothyroidism, liver disease, obesity or nephrotic syndrome. Factors aggravating his hyperlipidemia include thiazides, smoking and fatty foods. Pertinent negatives include no chest pain, focal sensory loss, focal weakness, leg pain, myalgias or shortness of breath.  Diabetes He presents for his follow-up diabetic visit. He has type 2 diabetes mellitus. There are no hypoglycemic associated symptoms. Pertinent negatives for hypoglycemia include no dizziness, headaches, pallor, seizures, speech difficulty, sweats or tremors. Associated symptoms include weakness. Pertinent negatives for diabetes include no blurred vision, no chest pain, no fatigue, no foot paresthesias, no foot ulcerations, no polydipsia, no polyphagia, no polyuria, no visual change and no weight loss. There are no hypoglycemic complications. Symptoms are stable. There are no diabetic complications. Current diabetic treatment includes oral agent (triple therapy) and intensive insulin program. He is compliant with treatment most of  the time. His weight is stable. He is following a generally healthy diet. Meal planning includes avoidance of concentrated sweets. He has not had a previous visit with a dietician. He never participates in exercise. There is no change in his home blood glucose trend. His breakfast blood glucose range is generally 110-130 mg/dl. His lunch blood glucose range is generally 130-140 mg/dl. His dinner blood glucose range is generally 140-180 mg/dl. His highest blood glucose is 140-180 mg/dl. His overall blood glucose range is 130-140 mg/dl. An ACE inhibitor/angiotensin II receptor blocker is being taken. He does not see a podiatrist.Eye exam is not current.  Neck Pain  This is a chronic problem. The current episode started more than 1 year ago. The problem occurs intermittently. The problem has been gradually worsening. The pain is associated with nothing. The pain is present in the left side. The quality of the pain is described as aching and shooting. The pain is at a severity of 6/10. The pain is moderate. The symptoms are aggravated by twisting. The pain is worse during the day. Stiffness is present all day. Associated symptoms include tingling (in his left arm) and weakness. Pertinent negatives include no chest pain, fever, headaches, leg pain, numbness, pain with swallowing, paresis, photophobia, syncope, trouble swallowing, visual change or weight loss. He has tried oral narcotics for the symptoms. The treatment provided moderate relief.      Review of Systems  Constitutional: Negative for fever, chills, weight loss, diaphoresis, activity change, appetite change, fatigue and unexpected weight change.  HENT: Positive for sore throat, neck pain and neck stiffness. Negative for ear pain, nosebleeds, congestion, facial swelling, rhinorrhea, sneezing, trouble swallowing, voice change, postnasal drip, sinus pressure and tinnitus.   Eyes: Negative.  Negative for blurred vision and photophobia.  Respiratory:  Positive for  cough and wheezing. Negative for hemoptysis, choking, chest tightness, shortness of breath and stridor.   Cardiovascular: Negative for chest pain, palpitations, leg swelling and syncope.  Gastrointestinal: Negative for heartburn, nausea, vomiting, abdominal pain, diarrhea, constipation and abdominal distention.  Genitourinary: Negative.  Negative for polyuria.  Musculoskeletal: Negative for myalgias, back pain, joint swelling, arthralgias and gait problem.  Skin: Negative for color change, pallor, rash and wound.  Neurological: Positive for tingling (in his left arm) and weakness. Negative for dizziness, tremors, focal weakness, seizures, syncope, facial asymmetry, speech difficulty, light-headedness, numbness and headaches.  Hematological: Negative for polydipsia, polyphagia and adenopathy. Does not bruise/bleed easily.  Psychiatric/Behavioral: Negative.        Objective:   Physical Exam  Vitals reviewed. Constitutional: He is oriented to person, place, and time. He appears well-developed and well-nourished.  Non-toxic appearance. He does not have a sickly appearance. He does not appear ill. No distress.  HENT:  Head: Normocephalic and atraumatic.  Mouth/Throat: No oropharyngeal exudate.  Eyes: Conjunctivae are normal. Right eye exhibits no discharge. Left eye exhibits no discharge. No scleral icterus.  Neck: Normal range of motion. Neck supple. No JVD present. No tracheal deviation present. No thyromegaly present.  Cardiovascular: Normal rate, regular rhythm, normal heart sounds and intact distal pulses.  Exam reveals no gallop and no friction rub.   No murmur heard. Pulmonary/Chest: Effort normal. No accessory muscle usage or stridor. Not tachypneic. No respiratory distress. He has no decreased breath sounds. He has no wheezes. He has rhonchi in the right middle field and the left middle field. He has no rales. He exhibits no tenderness.  Abdominal: Soft. Bowel sounds are  normal. He exhibits no distension and no mass. There is no tenderness. There is no rebound and no guarding.  Musculoskeletal: Normal range of motion. He exhibits no edema and no tenderness.       Cervical back: He exhibits deformity (scar from surgery). He exhibits normal range of motion, no tenderness, no bony tenderness, no swelling, no edema, no laceration, no pain, no spasm and normal pulse.  Lymphadenopathy:    He has no cervical adenopathy.  Neurological: He is oriented to person, place, and time.  Skin: Skin is warm and dry. No rash noted. He is not diaphoretic. No erythema. No pallor.  Psychiatric: He has a normal mood and affect. His behavior is normal. Judgment and thought content normal.      Lab Results  Component Value Date   WBC 7.2 01/14/2012   HGB 14.2 01/14/2012   HCT 41.2 01/14/2012   PLT 197.0 01/14/2012   GLUCOSE 128* 01/14/2012   CHOL 177 01/14/2012   TRIG 580.0* 01/14/2012   HDL 43.30 01/14/2012   LDLDIRECT 47.4 01/14/2012   ALT 33 01/14/2012   AST 35 01/14/2012   NA 137 01/14/2012   K 4.5 01/14/2012   CL 103 01/14/2012   CREATININE 1.4 01/14/2012   BUN 20 01/14/2012   CO2 26 01/14/2012   TSH 0.71 07/23/2011   HGBA1C 7.3* 01/14/2012      Assessment & Plan:

## 2012-02-14 ENCOUNTER — Other Ambulatory Visit: Payer: Self-pay | Admitting: Internal Medicine

## 2012-03-13 ENCOUNTER — Other Ambulatory Visit: Payer: Self-pay

## 2012-03-13 MED ORDER — GLIPIZIDE 10 MG PO TABS: ORAL_TABLET | ORAL | Status: DC

## 2012-04-13 ENCOUNTER — Other Ambulatory Visit (INDEPENDENT_AMBULATORY_CARE_PROVIDER_SITE_OTHER): Payer: Self-pay

## 2012-04-13 ENCOUNTER — Ambulatory Visit (INDEPENDENT_AMBULATORY_CARE_PROVIDER_SITE_OTHER): Payer: Self-pay | Admitting: Internal Medicine

## 2012-04-13 ENCOUNTER — Encounter: Payer: Self-pay | Admitting: Internal Medicine

## 2012-04-13 ENCOUNTER — Ambulatory Visit (INDEPENDENT_AMBULATORY_CARE_PROVIDER_SITE_OTHER)
Admission: RE | Admit: 2012-04-13 | Discharge: 2012-04-13 | Disposition: A | Discharge status: Home / Self Care | Payer: Self-pay | Source: Ambulatory Visit | Attending: Internal Medicine | Admitting: Internal Medicine

## 2012-04-13 VITALS — BP 136/90 | HR 103 | Temp 98.0°F | Wt 196.5 lb

## 2012-04-13 DIAGNOSIS — J449 Chronic obstructive pulmonary disease, unspecified: Secondary | ICD-10-CM

## 2012-04-13 DIAGNOSIS — M542 Cervicalgia: Secondary | ICD-10-CM

## 2012-04-13 DIAGNOSIS — I10 Essential (primary) hypertension: Secondary | ICD-10-CM

## 2012-04-13 DIAGNOSIS — F1410 Cocaine abuse, uncomplicated: Secondary | ICD-10-CM

## 2012-04-13 DIAGNOSIS — F17200 Nicotine dependence, unspecified, uncomplicated: Secondary | ICD-10-CM

## 2012-04-13 DIAGNOSIS — G894 Chronic pain syndrome: Secondary | ICD-10-CM

## 2012-04-13 DIAGNOSIS — B1710 Acute hepatitis C without hepatic coma: Secondary | ICD-10-CM

## 2012-04-13 DIAGNOSIS — E781 Pure hyperglyceridemia: Secondary | ICD-10-CM

## 2012-04-13 DIAGNOSIS — R51 Headache: Secondary | ICD-10-CM

## 2012-04-13 DIAGNOSIS — R3129 Other microscopic hematuria: Secondary | ICD-10-CM

## 2012-04-13 DIAGNOSIS — E785 Hyperlipidemia, unspecified: Secondary | ICD-10-CM

## 2012-04-13 DIAGNOSIS — E119 Type 2 diabetes mellitus without complications: Secondary | ICD-10-CM

## 2012-04-13 DIAGNOSIS — R519 Headache, unspecified: Secondary | ICD-10-CM | POA: Insufficient documentation

## 2012-04-13 LAB — LIPID PANEL
Cholesterol: 199 mg/dL (ref 0–200)
Total CHOL/HDL Ratio: 4

## 2012-04-13 LAB — CBC WITH DIFFERENTIAL/PLATELET
Basophils Relative: 0.7 % (ref 0.0–3.0)
Eosinophils Relative: 1.1 % (ref 0.0–5.0)
Hemoglobin: 15.9 g/dL (ref 13.0–17.0)
Lymphocytes Relative: 17.8 % (ref 12.0–46.0)
MCHC: 34.3 g/dL (ref 30.0–36.0)
Monocytes Relative: 8.2 % (ref 3.0–12.0)
Neutro Abs: 7.1 10*3/uL (ref 1.4–7.7)
RBC: 5.04 Mil/uL (ref 4.22–5.81)
WBC: 9.9 10*3/uL (ref 4.5–10.5)

## 2012-04-13 LAB — URINALYSIS, ROUTINE W REFLEX MICROSCOPIC
Bilirubin Urine: NEGATIVE
Ketones, ur: NEGATIVE
Total Protein, Urine: >=300
Urine Glucose: >=1000

## 2012-04-13 LAB — COMPREHENSIVE METABOLIC PANEL
Albumin: 3 g/dL — ABNORMAL LOW (ref 3.5–5.2)
BUN: 19 mg/dL (ref 6–23)
CO2: 25 mEq/L (ref 19–32)
Calcium: 9.8 mg/dL (ref 8.4–10.5)
Chloride: 103 mEq/L (ref 96–112)
Creatinine, Ser: 1.4 mg/dL (ref 0.4–1.5)
GFR: 59.34 mL/min — ABNORMAL LOW (ref >60.00)

## 2012-04-13 LAB — HEMOGLOBIN A1C: Hgb A1c MFr Bld: 8.4 % — ABNORMAL HIGH (ref 4.6–6.5)

## 2012-04-13 MED ORDER — ELETRIPTAN HYDROBROMIDE 40 MG PO TABS: 40.0000 mg | ORAL_TABLET | ORAL | Status: DC | PRN

## 2012-04-13 NOTE — Assessment & Plan Note (Signed)
He is doing well on his current meds, I will recheck his FLP today

## 2012-04-13 NOTE — Assessment & Plan Note (Signed)
UDS today so see if he has abused cocaine recently

## 2012-04-13 NOTE — Assessment & Plan Note (Signed)
I will recheck his UA today 

## 2012-04-13 NOTE — Patient Instructions (Signed)

## 2012-04-13 NOTE — Assessment & Plan Note (Signed)
He will try relpax for pain relief, I will scan his brain today to look for bleed, mass, etc. and will check his labs to look for inflammation, vasculitis, etc.

## 2012-04-13 NOTE — Progress Notes (Signed)
Subjective:    Patient ID: Alvin Figueroa, male    DOB: [birth year], 46 y.o.   MRN: [id number]  Headache  This is a new problem. Episode onset: 2 weeks ago. The problem occurs constantly. The problem has been unchanged. The pain is located in the bilateral and occipital region. The pain quality is similar to prior headaches. The quality of the pain is described as sharp and aching. The pain is at a severity of 2/10. The pain is mild. Associated symptoms include nausea, neck pain, phonophobia and photophobia. Pertinent negatives include no abdominal pain, abnormal behavior, anorexia, back pain, blurred vision, coughing, dizziness, drainage, ear pain, eye pain, eye redness, eye watering, facial sweating, fever, hearing loss, insomnia, loss of balance, muscle aches, numbness, rhinorrhea, scalp tenderness, seizures, sinus pressure, sore throat, swollen glands, tingling, tinnitus, visual change, vomiting, weakness or weight loss. He has tried acetaminophen and oral narcotics for the symptoms. The treatment provided moderate relief. His past medical history is significant for hypertension, migraine headaches and migraines in the family. There is no history of cancer, obesity, pseudotumor cerebri, recent head traumas, sinus disease or TMJ.      Review of Systems  Constitutional: Negative for fever, chills, weight loss, diaphoresis, activity change, appetite change, fatigue and unexpected weight change.  HENT: Positive for neck pain and neck stiffness (chronic,unchanged). Negative for hearing loss, ear pain, sore throat, facial swelling (chronic, unchanged), rhinorrhea, sinus pressure and tinnitus.   Eyes: Positive for photophobia. Negative for blurred vision, pain, discharge, redness, itching and visual disturbance.  Respiratory: Negative for cough, chest tightness, shortness of breath, wheezing and stridor.   Cardiovascular: Negative for chest pain, palpitations and leg swelling.  Gastrointestinal:  Positive for nausea. Negative for vomiting, abdominal pain, diarrhea, constipation, blood in stool, abdominal distention, anal bleeding, rectal pain and anorexia.  Genitourinary: Negative.   Musculoskeletal: Negative for myalgias, back pain, joint swelling, arthralgias and gait problem.  Skin: Negative for color change, pallor, rash and wound.  Neurological: Positive for headaches. Negative for dizziness, tingling, tremors, seizures, syncope, facial asymmetry, speech difficulty, weakness, light-headedness, numbness and loss of balance.  Hematological: Negative for adenopathy. Does not bruise/bleed easily.  Psychiatric/Behavioral: Negative.  The patient does not have insomnia.        Objective:   Physical Exam  Vitals reviewed. Constitutional: He is oriented to person, place, and time. He appears well-developed and well-nourished. No distress.  HENT:  Head: Normocephalic and atraumatic.  Mouth/Throat: Oropharynx is clear and moist. No oropharyngeal exudate.  Eyes: Conjunctivae and EOM are normal. Pupils are equal, round, and reactive to light. Right eye exhibits no discharge. Left eye exhibits no discharge. No scleral icterus.  Neck: Normal range of motion. Neck supple. No JVD present. No tracheal deviation present. No thyromegaly present.  Cardiovascular: Normal rate, regular rhythm, normal heart sounds and intact distal pulses.  Exam reveals no gallop and no friction rub.   No murmur heard. Pulmonary/Chest: Effort normal and breath sounds normal. No stridor. No respiratory distress. He has no wheezes. He has no rales. He exhibits no tenderness.  Abdominal: Soft. Bowel sounds are normal. He exhibits no distension and no mass. There is no tenderness. There is no rebound and no guarding.  Musculoskeletal: Normal range of motion. He exhibits no edema and no tenderness.  Lymphadenopathy:    He has no cervical adenopathy.  Neurological: He is alert and oriented to person, place, and time. He has  normal reflexes. He displays normal reflexes. No cranial nerve deficit. He  exhibits normal muscle tone. Coordination normal.  Skin: Skin is warm and dry. No rash noted. He is not diaphoretic. No erythema. No pallor.  Psychiatric: He has a normal mood and affect. His behavior is normal. Judgment and thought content normal.      Lab Results  Component Value Date   WBC 7.2 01/14/2012   HGB 14.2 01/14/2012   HCT 41.2 01/14/2012   PLT 197.0 01/14/2012   GLUCOSE 128* 01/14/2012   CHOL 177 01/14/2012   TRIG 580.0* 01/14/2012   HDL 43.30 01/14/2012   LDLDIRECT 47.4 01/14/2012   ALT 33 01/14/2012   AST 35 01/14/2012   NA 137 01/14/2012   K 4.5 01/14/2012   CL 103 01/14/2012   CREATININE 1.4 01/14/2012   BUN 20 01/14/2012   CO2 26 01/14/2012   TSH 0.71 07/23/2011   HGBA1C 7.3* 01/14/2012      Assessment & Plan:

## 2012-04-13 NOTE — Assessment & Plan Note (Signed)
He has adequate BP control, I will check his lytes and renal function 

## 2012-04-13 NOTE — Assessment & Plan Note (Signed)
Continue current meds for pain 

## 2012-04-13 NOTE — Assessment & Plan Note (Signed)
I will check his a1c today and will monitor his renal function 

## 2012-04-14 ENCOUNTER — Encounter: Payer: Self-pay | Admitting: Internal Medicine

## 2012-04-14 LAB — DRUGS OF ABUSE SCREEN W/O ALC, ROUTINE URINE
Benzodiazepines.: NEGATIVE
Marijuana Metabolite: NEGATIVE
Methadone: NEGATIVE
Propoxyphene: NEGATIVE

## 2012-04-15 LAB — OPIATES/OPIOIDS (LC/MS-MS)
Codeine Urine: NEGATIVE ng/mL
Hydrocodone: 1682 ng/mL
Oxycodone, ur: NEGATIVE ng/mL

## 2012-04-15 LAB — COCAINE METABOLITE (GC/LC/MS), URINE: Benzoylecgonine GC/MS Conf: 438 ng/mL — ABNORMAL HIGH

## 2012-04-18 ENCOUNTER — Other Ambulatory Visit: Payer: Self-pay | Admitting: Internal Medicine

## 2012-05-01 ENCOUNTER — Ambulatory Visit: Payer: Self-pay | Admitting: Internal Medicine

## 2012-06-01 ENCOUNTER — Other Ambulatory Visit: Payer: Self-pay | Admitting: Internal Medicine

## 2012-06-12 ENCOUNTER — Ambulatory Visit: Payer: Self-pay | Admitting: Internal Medicine

## 2012-06-19 ENCOUNTER — Ambulatory Visit (INDEPENDENT_AMBULATORY_CARE_PROVIDER_SITE_OTHER): Payer: Self-pay | Admitting: Internal Medicine

## 2012-06-19 ENCOUNTER — Encounter: Payer: Self-pay | Admitting: Internal Medicine

## 2012-06-19 ENCOUNTER — Other Ambulatory Visit (INDEPENDENT_AMBULATORY_CARE_PROVIDER_SITE_OTHER): Payer: Self-pay

## 2012-06-19 VITALS — BP 130/74 | HR 80 | Temp 98.6°F | Resp 20 | Wt 197.8 lb

## 2012-06-19 DIAGNOSIS — E785 Hyperlipidemia, unspecified: Secondary | ICD-10-CM

## 2012-06-19 DIAGNOSIS — I10 Essential (primary) hypertension: Secondary | ICD-10-CM

## 2012-06-19 DIAGNOSIS — F1410 Cocaine abuse, uncomplicated: Secondary | ICD-10-CM

## 2012-06-19 DIAGNOSIS — R3129 Other microscopic hematuria: Secondary | ICD-10-CM

## 2012-06-19 DIAGNOSIS — Z23 Encounter for immunization: Secondary | ICD-10-CM

## 2012-06-19 DIAGNOSIS — E119 Type 2 diabetes mellitus without complications: Secondary | ICD-10-CM

## 2012-06-19 DIAGNOSIS — M545 Low back pain: Secondary | ICD-10-CM

## 2012-06-19 DIAGNOSIS — E781 Pure hyperglyceridemia: Secondary | ICD-10-CM

## 2012-06-19 DIAGNOSIS — M542 Cervicalgia: Secondary | ICD-10-CM

## 2012-06-19 LAB — URINALYSIS, ROUTINE W REFLEX MICROSCOPIC
Bilirubin Urine: NEGATIVE
Leukocytes, UA: NEGATIVE
Nitrite: NEGATIVE
Total Protein, Urine: 100
Urobilinogen, UA: 0.2 (ref 0.0–1.0)

## 2012-06-19 LAB — COMPREHENSIVE METABOLIC PANEL
ALT: 37 U/L (ref 0–53)
Albumin: 3.1 g/dL — ABNORMAL LOW (ref 3.5–5.2)
CO2: 24 mEq/L (ref 19–32)
GFR: 47.11 mL/min — ABNORMAL LOW (ref >60.00)
Glucose, Bld: 311 mg/dL — ABNORMAL HIGH (ref 70–99)
Potassium: 4.6 mEq/L (ref 3.5–5.1)
Sodium: 133 mEq/L — ABNORMAL LOW (ref 135–145)
Total Bilirubin: 0.6 mg/dL (ref 0.3–1.2)
Total Protein: 6.8 g/dL (ref 6.0–8.3)

## 2012-06-19 LAB — HEMOGLOBIN A1C: Hgb A1c MFr Bld: 9.1 % — ABNORMAL HIGH (ref 4.6–6.5)

## 2012-06-19 LAB — LIPID PANEL
Total CHOL/HDL Ratio: 5
Triglycerides: 469 mg/dL — ABNORMAL HIGH (ref 0.0–149.0)

## 2012-06-19 MED ORDER — CANAGLIFLOZIN 100 MG PO TABS: 1.0000 | ORAL_TABLET | Freq: Every day | ORAL | Status: DC

## 2012-06-19 MED ORDER — GLUCOSE BLOOD VI STRP: ORAL_STRIP | Status: AC

## 2012-06-19 MED ORDER — HYDROCODONE-ACETAMINOPHEN 10-325 MG PO TABS: 1.0000 | ORAL_TABLET | Freq: Four times a day (QID) | ORAL | Status: DC | PRN

## 2012-06-19 NOTE — Patient Instructions (Signed)

## 2012-06-19 NOTE — Progress Notes (Signed)
Subjective:    Patient ID: Alvin Figueroa, male    DOB: 19-May-1966, 46 y.o.   MRN: [id number]  Diabetes He presents for his follow-up diabetic visit. He has type 2 diabetes mellitus. His disease course has been worsening. There are no hypoglycemic associated symptoms. Pertinent negatives for hypoglycemia include no dizziness, headaches, pallor, sweats or tremors. Associated symptoms include fatigue, polydipsia, polyphagia and polyuria. Pertinent negatives for diabetes include no blurred vision, no chest pain, no foot paresthesias, no foot ulcerations, no visual change, no weakness and no weight loss. There are no hypoglycemic complications. Symptoms are stable. Diabetic complications include nephropathy. Current diabetic treatment includes oral agent (triple therapy) and insulin injections. He is compliant with treatment some of the time. His weight is stable. He is following a generally unhealthy diet. When asked about meal planning, he reported none. He never participates in exercise. His breakfast blood glucose range is generally >200 mg/dl. His lunch blood glucose range is generally >200 mg/dl. His dinner blood glucose range is generally >200 mg/dl. His highest blood glucose is >200 mg/dl. His overall blood glucose range is >200 mg/dl. An ACE inhibitor/angiotensin II receptor blocker is being taken. He does not see a podiatrist.Eye exam is not current.  Hypertension This is a chronic problem. The current episode started more than 1 year ago. The problem is controlled. Associated symptoms include neck pain (chronic, unchanged). Pertinent negatives include no anxiety, blurred vision, chest pain, headaches, malaise/fatigue, orthopnea, palpitations, peripheral edema, PND, shortness of breath or sweats. Associated agents: cocaine. Past treatments include angiotensin blockers, calcium channel blockers and diuretics. The current treatment provides significant improvement. Compliance problems include  medication cost, exercise and diet.  Hypertensive end-organ damage includes kidney disease.      Review of Systems  Constitutional: Positive for fatigue. Negative for fever, chills, weight loss, malaise/fatigue, diaphoresis, activity change, appetite change and unexpected weight change.  HENT: Positive for neck pain (chronic, unchanged). Negative for nosebleeds and trouble swallowing.   Eyes: Negative.  Negative for blurred vision.  Respiratory: Negative for apnea, cough, choking, chest tightness, shortness of breath, wheezing and stridor.   Cardiovascular: Negative for chest pain, palpitations, orthopnea, leg swelling and PND.  Gastrointestinal: Negative for nausea, vomiting, abdominal pain, diarrhea, constipation and blood in stool.  Genitourinary: Positive for polyuria.  Musculoskeletal: Positive for back pain (chronic, unchanged). Negative for myalgias, joint swelling, arthralgias and gait problem.  Skin: Negative for color change, pallor, rash and wound.  Neurological: Negative for dizziness, tremors, syncope, weakness, light-headedness, numbness and headaches.  Hematological: Positive for polydipsia and polyphagia. Negative for adenopathy. Does not bruise/bleed easily.  Psychiatric/Behavioral: Negative.        Objective:   Physical Exam  Vitals reviewed. Constitutional: He is oriented to person, place, and time. He appears well-developed and well-nourished. No distress.  HENT:  Head: Normocephalic and atraumatic.  Mouth/Throat: Oropharynx is clear and moist. No oropharyngeal exudate.  Eyes: Conjunctivae normal are normal. Right eye exhibits no discharge. Left eye exhibits no discharge. No scleral icterus.  Neck: Normal range of motion. Neck supple. No JVD present. No tracheal deviation present. No thyromegaly present.  Cardiovascular: Normal rate, regular rhythm, normal heart sounds and intact distal pulses.  Exam reveals no gallop and no friction rub.   No murmur  heard. Pulmonary/Chest: Effort normal. No accessory muscle usage or stridor. Not tachypneic. No respiratory distress. He has no decreased breath sounds. He has no wheezes. He has rhonchi in the right middle field and the left middle field. He  has no rales. He exhibits no tenderness.  Abdominal: Soft. Bowel sounds are normal. He exhibits no distension and no mass. There is no tenderness. There is no rebound and no guarding.  Musculoskeletal: Normal range of motion. He exhibits no edema and no tenderness.  Lymphadenopathy:    He has no cervical adenopathy.  Neurological: He is alert and oriented to person, place, and time. He has normal reflexes. He displays normal reflexes. No cranial nerve deficit. He exhibits normal muscle tone. Coordination normal.  Skin: Skin is warm and dry. No rash noted. He is not diaphoretic. No erythema. No pallor.  Psychiatric: His behavior is normal. Judgment normal.      Lab Results  Component Value Date   WBC 9.9 04/13/2012   HGB 15.9 04/13/2012   HCT 46.4 04/13/2012   PLT 248.0 04/13/2012   GLUCOSE 186* 04/13/2012   CHOL 199 04/13/2012   TRIG 457.0* 04/13/2012   HDL 47.10 04/13/2012   LDLDIRECT 65.7 04/13/2012   ALT 31 04/13/2012   AST 29 04/13/2012   NA 138 04/13/2012   K 4.4 04/13/2012   CL 103 04/13/2012   CREATININE 1.4 04/13/2012   BUN 19 04/13/2012   CO2 25 04/13/2012   TSH 0.71 07/23/2011   HGBA1C 8.4* 04/13/2012      Assessment & Plan:

## 2012-06-20 LAB — DRUGS OF ABUSE SCREEN W/O ALC, ROUTINE URINE
Amphetamine Screen, Ur: NEGATIVE
Barbiturate Quant, Ur: NEGATIVE
Marijuana Metabolite: NEGATIVE
Methadone: NEGATIVE
Opiate Screen, Urine: NEGATIVE
Propoxyphene: NEGATIVE

## 2012-06-21 ENCOUNTER — Encounter: Payer: Self-pay | Admitting: Internal Medicine

## 2012-06-21 NOTE — Assessment & Plan Note (Addendum)
His blood sugars are too high and his creatinine has increased so he may need to stop taking merformin, I will recheck his renal function in 2-3 weeks and if the creatinine remains about 1.6 then I will ask him to stop due to the risk of developing metabolic acidosis, today I have added invokana for better blood sugar control, also I have asked him to see ENDO to seek further treatment options.

## 2012-06-21 NOTE — Assessment & Plan Note (Signed)
Today he told me that he has not used cocaine for 3 months yet his UDS is + again so I have referred him to psych for evaluation and treatment

## 2012-06-21 NOTE — Assessment & Plan Note (Signed)
His BP is well controlled 

## 2012-06-21 NOTE — Assessment & Plan Note (Signed)
Will continue his current regimen for pain control but I have lowered the dose of tylenol

## 2012-07-01 ENCOUNTER — Other Ambulatory Visit: Payer: Self-pay

## 2012-07-01 MED ORDER — "PEN NEEDLES 3/16"" 31G X 5 MM MISC": 1.0000 | Freq: Every day | Status: DC

## 2012-08-17 ENCOUNTER — Other Ambulatory Visit: Payer: Self-pay | Admitting: Internal Medicine

## 2012-08-31 ENCOUNTER — Other Ambulatory Visit: Payer: Self-pay | Admitting: Internal Medicine

## 2012-09-25 ENCOUNTER — Ambulatory Visit: Payer: Self-pay | Admitting: Internal Medicine

## 2012-09-25 ENCOUNTER — Other Ambulatory Visit: Payer: Self-pay | Admitting: Internal Medicine

## 2012-09-26 NOTE — Telephone Encounter (Signed)
Med filled.  

## 2012-10-16 ENCOUNTER — Encounter: Payer: Self-pay | Admitting: Internal Medicine

## 2012-10-16 ENCOUNTER — Ambulatory Visit (INDEPENDENT_AMBULATORY_CARE_PROVIDER_SITE_OTHER): Payer: Self-pay | Admitting: Internal Medicine

## 2012-10-16 ENCOUNTER — Other Ambulatory Visit (INDEPENDENT_AMBULATORY_CARE_PROVIDER_SITE_OTHER): Payer: Self-pay

## 2012-10-16 VITALS — BP 130/82 | HR 96 | Temp 98.7°F | Resp 16 | Wt 203.4 lb

## 2012-10-16 DIAGNOSIS — E785 Hyperlipidemia, unspecified: Secondary | ICD-10-CM

## 2012-10-16 DIAGNOSIS — R3129 Other microscopic hematuria: Secondary | ICD-10-CM

## 2012-10-16 DIAGNOSIS — E781 Pure hyperglyceridemia: Secondary | ICD-10-CM

## 2012-10-16 DIAGNOSIS — I10 Essential (primary) hypertension: Secondary | ICD-10-CM

## 2012-10-16 DIAGNOSIS — E119 Type 2 diabetes mellitus without complications: Secondary | ICD-10-CM

## 2012-10-16 DIAGNOSIS — E1129 Type 2 diabetes mellitus with other diabetic kidney complication: Secondary | ICD-10-CM

## 2012-10-16 DIAGNOSIS — E1165 Type 2 diabetes mellitus with hyperglycemia: Secondary | ICD-10-CM

## 2012-10-16 DIAGNOSIS — M542 Cervicalgia: Secondary | ICD-10-CM

## 2012-10-16 DIAGNOSIS — M545 Low back pain, unspecified: Secondary | ICD-10-CM

## 2012-10-16 LAB — CBC WITH DIFFERENTIAL/PLATELET
Basophils Relative: 0.5 % (ref 0.0–3.0)
Eosinophils Absolute: 0.2 10*3/uL (ref 0.0–0.7)
Lymphocytes Relative: 26.2 % (ref 12.0–46.0)
MCHC: 35.5 g/dL (ref 30.0–36.0)
MCV: 90.5 fl (ref 78.0–100.0)
Monocytes Absolute: 0.7 10*3/uL (ref 0.1–1.0)
Neutrophils Relative %: 64.5 % (ref 43.0–77.0)
Platelets: 233 10*3/uL (ref 150.0–400.0)
RBC: 5 Mil/uL (ref 4.22–5.81)
WBC: 9.7 10*3/uL (ref 4.5–10.5)

## 2012-10-16 LAB — URINALYSIS, ROUTINE W REFLEX MICROSCOPIC
Bilirubin Urine: NEGATIVE
Leukocytes, UA: NEGATIVE
Nitrite: NEGATIVE
Total Protein, Urine: >=300
pH: 6 (ref 5.0–8.0)

## 2012-10-16 LAB — COMPREHENSIVE METABOLIC PANEL
ALT: 51 U/L (ref 0–53)
AST: 30 U/L (ref 0–37)
Albumin: 3.2 g/dL — ABNORMAL LOW (ref 3.5–5.2)
Alkaline Phosphatase: 105 U/L (ref 39–117)
Calcium: 9.4 mg/dL (ref 8.4–10.5)
Chloride: 99 mEq/L (ref 96–112)
Potassium: 4.2 mEq/L (ref 3.5–5.1)
Sodium: 133 mEq/L — ABNORMAL LOW (ref 135–145)
Total Protein: 6.9 g/dL (ref 6.0–8.3)

## 2012-10-16 MED ORDER — HYDROCODONE-ACETAMINOPHEN 10-325 MG PO TABS: 1.0000 | ORAL_TABLET | Freq: Four times a day (QID) | ORAL | Status: DC | PRN

## 2012-10-16 NOTE — Patient Instructions (Signed)

## 2012-10-16 NOTE — Progress Notes (Signed)
Subjective:    Patient ID: Alvin Figueroa, male    DOB: 01-Feb-1966, 47 y.o.   MRN: [id number]  Diabetes He presents for his follow-up diabetic visit. He has type 2 diabetes mellitus. His disease course has been worsening. There are no hypoglycemic associated symptoms. Pertinent negatives for hypoglycemia include no dizziness or headaches. Associated symptoms include fatigue and polyuria. Pertinent negatives for diabetes include no blurred vision, no chest pain, no foot paresthesias, no foot ulcerations, no polydipsia, no polyphagia, no visual change, no weakness and no weight loss. There are no hypoglycemic complications. Diabetic complications include impotence. When asked about current treatments, none (he has not been able to buy his meds for a few weeks) were reported. He is compliant with treatment some of the time. His weight is stable. He is following a generally unhealthy diet. When asked about meal planning, he reported none. He has not had a previous visit with a dietician. He never participates in exercise. There is no change in his home blood glucose trend. An ACE inhibitor/angiotensin II receptor blocker is being taken. He does not see a podiatrist.Eye exam is not current.      Review of Systems  Constitutional: Positive for fatigue. Negative for fever, chills, weight loss, diaphoresis, activity change, appetite change and unexpected weight change.  HENT: Positive for neck pain (chronic,unchanged). Negative for facial swelling, trouble swallowing and neck stiffness.   Eyes: Negative.  Negative for blurred vision.  Respiratory: Negative for cough, chest tightness, shortness of breath, wheezing and stridor.   Cardiovascular: Negative for chest pain, palpitations and leg swelling.  Gastrointestinal: Negative.  Negative for abdominal pain.  Endocrine: Positive for polyuria. Negative for cold intolerance, heat intolerance, polydipsia and polyphagia.  Genitourinary: Positive for  impotence. Negative for dysuria, urgency, frequency, hematuria, flank pain, decreased urine volume, enuresis and difficulty urinating.  Musculoskeletal: Negative for myalgias, back pain, joint swelling, arthralgias and gait problem.  Allergic/Immunologic: Negative.   Neurological: Negative for dizziness, weakness, light-headedness and headaches.  Hematological: Negative for adenopathy. Does not bruise/bleed easily.  Psychiatric/Behavioral: Negative.        Objective:   Physical Exam  Vitals reviewed. Constitutional: He is oriented to person, place, and time. He appears well-developed and well-nourished. No distress.  HENT:  Head: Normocephalic and atraumatic.  Mouth/Throat: Oropharynx is clear and moist. No oropharyngeal exudate.  Eyes: Conjunctivae are normal. Right eye exhibits no discharge. Left eye exhibits no discharge. No scleral icterus.  Neck: Normal range of motion. Neck supple. No JVD present. No tracheal deviation present. No thyromegaly present.  Cardiovascular: Normal rate, regular rhythm, normal heart sounds and intact distal pulses.  Exam reveals no gallop and no friction rub.   No murmur heard. Pulmonary/Chest: Effort normal and breath sounds normal. No stridor. No respiratory distress. He has no wheezes. He has no rales. He exhibits no tenderness.  Abdominal: Soft. Bowel sounds are normal. He exhibits no distension and no mass. There is no tenderness. There is no rebound and no guarding.  Musculoskeletal: Normal range of motion. He exhibits no edema and no tenderness.       Cervical back: Normal. He exhibits normal range of motion, no tenderness, no bony tenderness, no swelling, no edema, no deformity and no spasm.  Lymphadenopathy:    He has no cervical adenopathy.  Neurological: He is alert and oriented to person, place, and time. He has normal reflexes. He displays normal reflexes. No cranial nerve deficit. He exhibits normal muscle tone. Coordination normal.  Skin:  Skin  is warm and dry. No rash noted. He is not diaphoretic. No erythema. No pallor.  Psychiatric: He has a normal mood and affect. His behavior is normal. Judgment and thought content normal.     Lab Results  Component Value Date   WBC 9.9 04/13/2012   HGB 15.9 04/13/2012   HCT 46.4 04/13/2012   PLT 248.0 04/13/2012   GLUCOSE 311* 06/19/2012   CHOL 228* 06/19/2012   TRIG 469.0 Triglyceride is over 400; calculations on Lipids are invalid.* 06/19/2012   HDL 45.00 06/19/2012   LDLDIRECT 112.4 06/19/2012   ALT 37 06/19/2012   AST 34 06/19/2012   NA 133* 06/19/2012   K 4.6 06/19/2012   CL 100 06/19/2012   CREATININE 1.7* 06/19/2012   BUN 36* 06/19/2012   CO2 24 06/19/2012   TSH 1.11 06/19/2012   HGBA1C 9.1* 06/19/2012       Assessment & Plan:

## 2012-10-17 ENCOUNTER — Encounter: Payer: Self-pay | Admitting: Internal Medicine

## 2012-10-17 MED ORDER — SITAGLIPTIN PHOS-METFORMIN HCL 50-1000 MG PO TABS: 1.0000 | ORAL_TABLET | Freq: Two times a day (BID) | ORAL | Status: DC

## 2012-10-17 MED ORDER — OMEGA-3-ACID ETHYL ESTERS 1 G PO CAPS: 2.0000 g | ORAL_CAPSULE | Freq: Two times a day (BID) | ORAL | Status: DC

## 2012-10-17 MED ORDER — OLMESARTAN-AMLODIPINE-HCTZ 40-5-12.5 MG PO TABS: 1.0000 | ORAL_TABLET | Freq: Every day | ORAL | Status: DC

## 2012-10-17 MED ORDER — GLIPIZIDE 10 MG PO TABS: 10.0000 mg | ORAL_TABLET | Freq: Two times a day (BID) | ORAL | Status: DC

## 2012-10-17 MED ORDER — CANAGLIFLOZIN 100 MG PO TABS: 1.0000 | ORAL_TABLET | Freq: Every day | ORAL | Status: DC

## 2012-10-17 MED ORDER — PRAVASTATIN SODIUM 40 MG PO TABS: 40.0000 mg | ORAL_TABLET | Freq: Every day | ORAL | Status: DC

## 2012-10-17 MED ORDER — INSULIN GLARGINE 100 UNIT/ML ~~LOC~~ SOLN: 50.0000 [IU] | Freq: Every day | SUBCUTANEOUS | Status: DC

## 2012-10-17 NOTE — Assessment & Plan Note (Signed)
His BP is well controlled Today I will check his lytes and renal function 

## 2012-10-17 NOTE — Assessment & Plan Note (Signed)
Urology referral

## 2012-10-17 NOTE — Assessment & Plan Note (Addendum)
Still high He was notified and I have asked him to start vayarol

## 2012-10-17 NOTE — Assessment & Plan Note (Signed)
His a1c is still high I gave him samples of his meds today

## 2012-10-17 NOTE — Assessment & Plan Note (Signed)
Repeat FLP today.

## 2012-10-17 NOTE — Assessment & Plan Note (Signed)
He is getting adequate relief with norco On exam there are no changes

## 2012-10-29 IMAGING — CT CT HEAD W/O CM
1 series · 16 of 30 positions shown, 20 images · non-contrast
Comparison: None.

CLINICAL DATA: Severe headaches and dizziness.  Nausea.  The
patient reports a history of a cyst removed from the brain several
years ago.  He had migraine headaches prior to that surgery.

CT HEAD WITHOUT CONTRAST
TECHNIQUE: Contiguous axial images were obtained from the base of
the skull through the vertex without contrast.

[Series 2: head_seq -c 4.5 h37s st · axial · 0.46mm/px · z∈[-103,+45]mm · 16 of 36 slices shown, 20 images]
[im 2/36  brain]
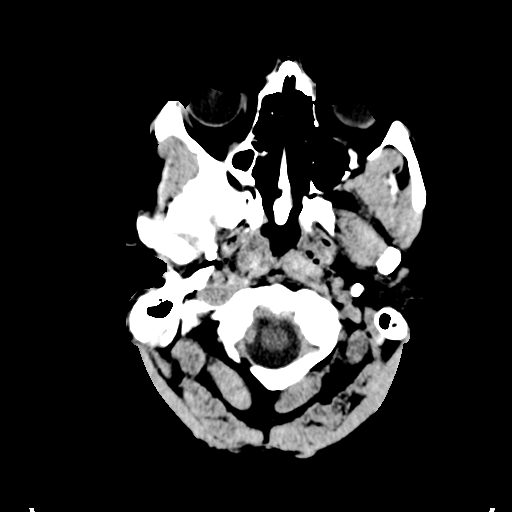
[im 2/36  bone]
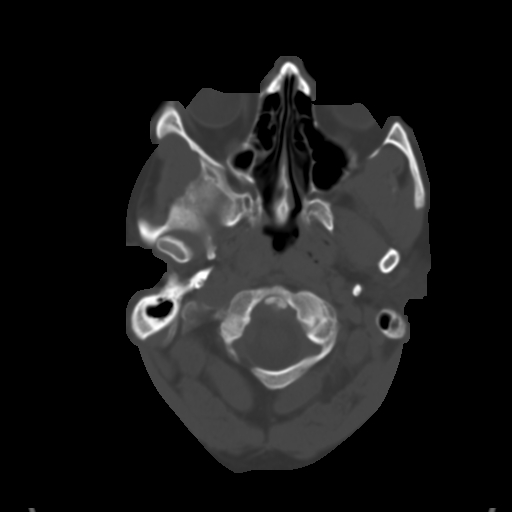
[im 4/36  brain]
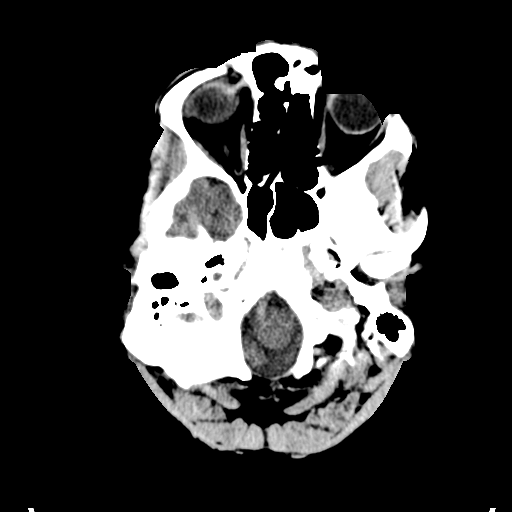
[im 7/36  brain]
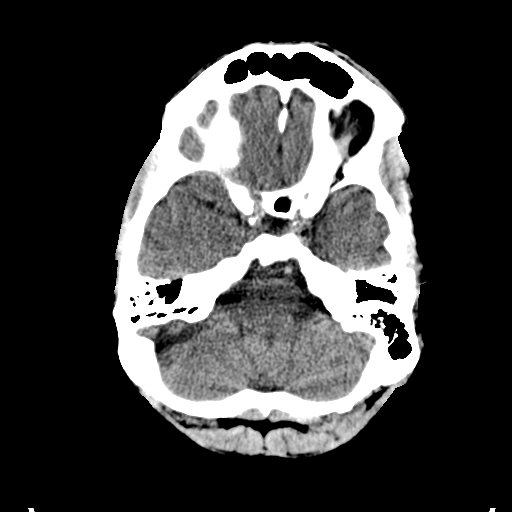
[im 9/36  brain]
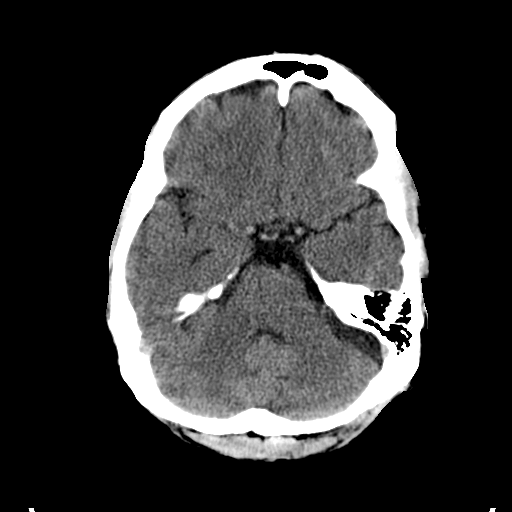
[im 10/36  brain]
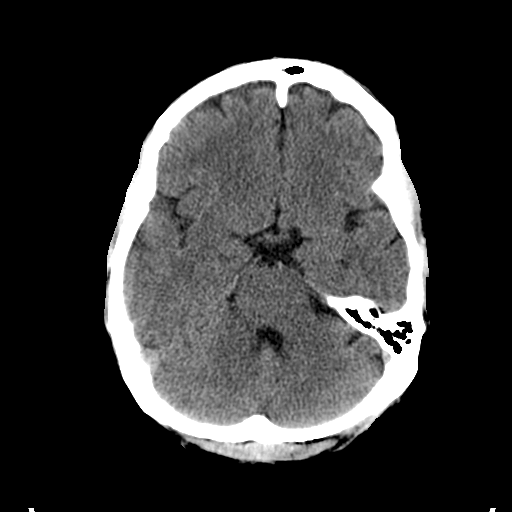
[im 10/36  bone]
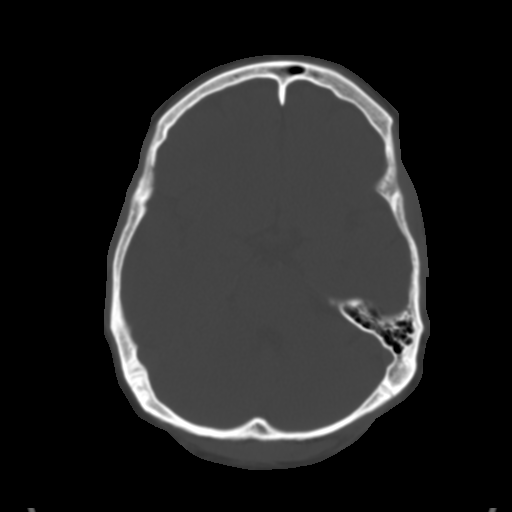
[im 13/36  brain]
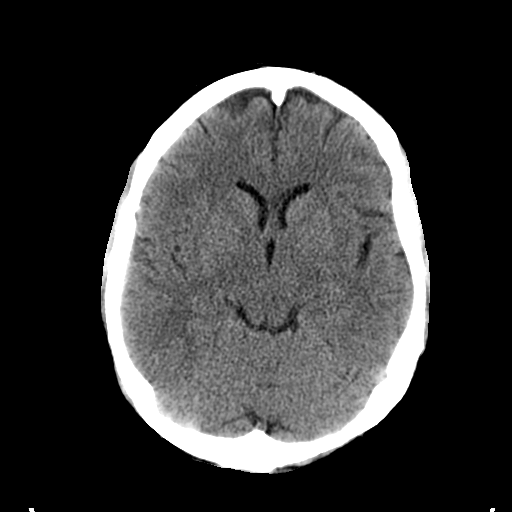
[im 15/36  brain]
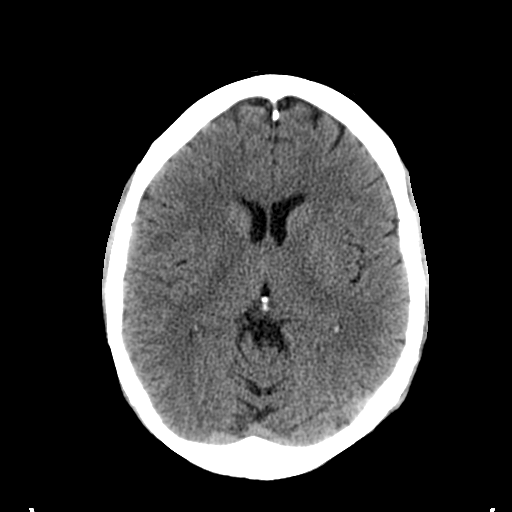
[im 17/36  brain]
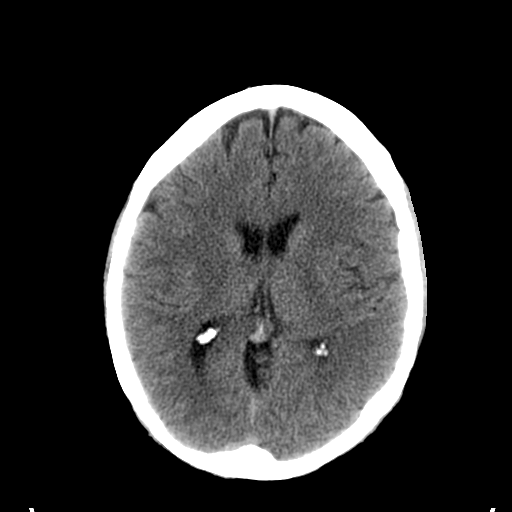
[im 19/36  brain]
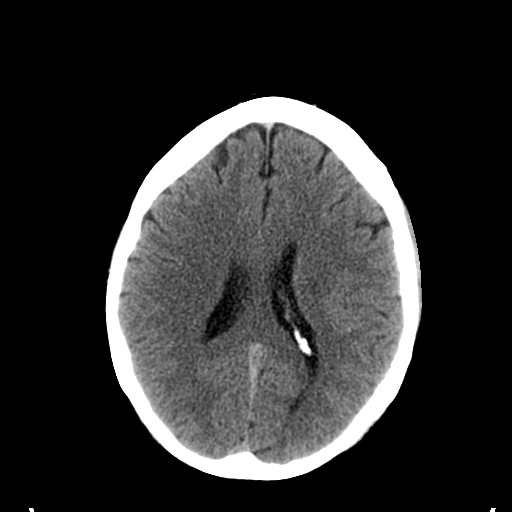
[im 19/36  bone]
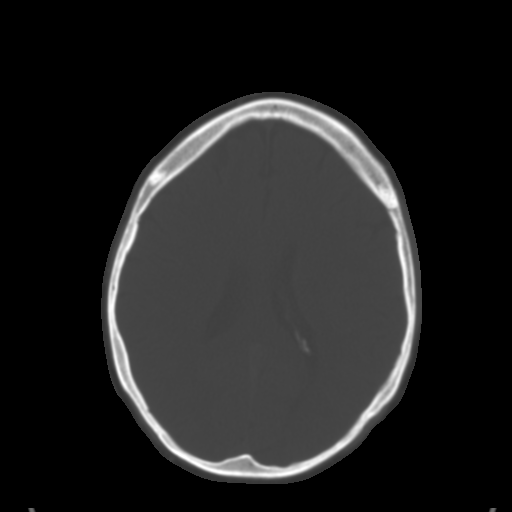
[im 21/36  brain]
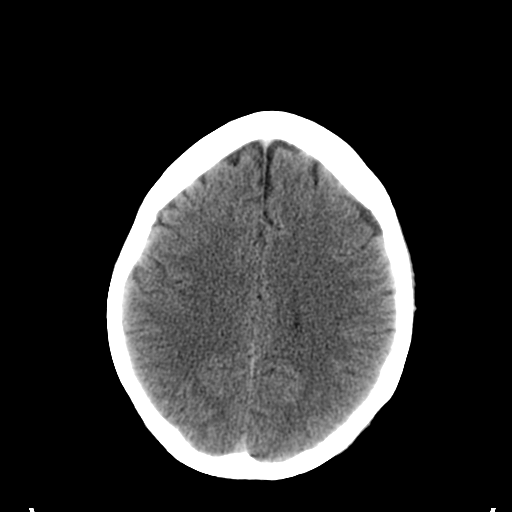
[im 23/36  brain]
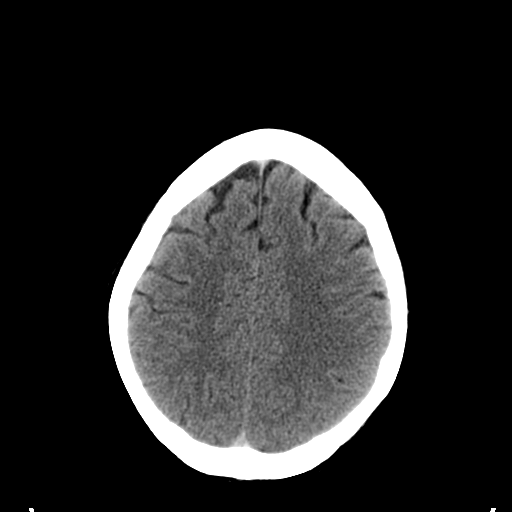
[im 26/36  brain]
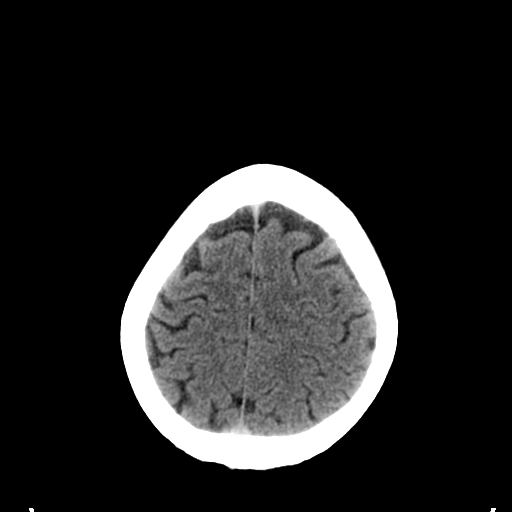
[im 27/36  brain]
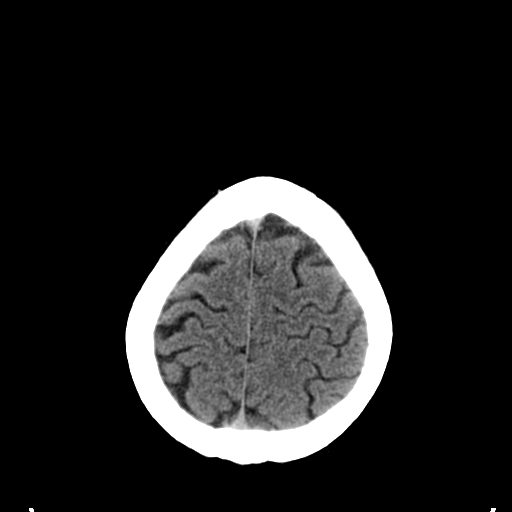
[im 27/36  bone]
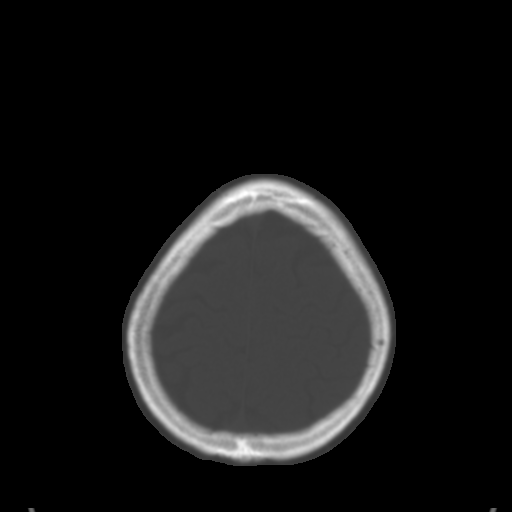
[im 29/36  brain]
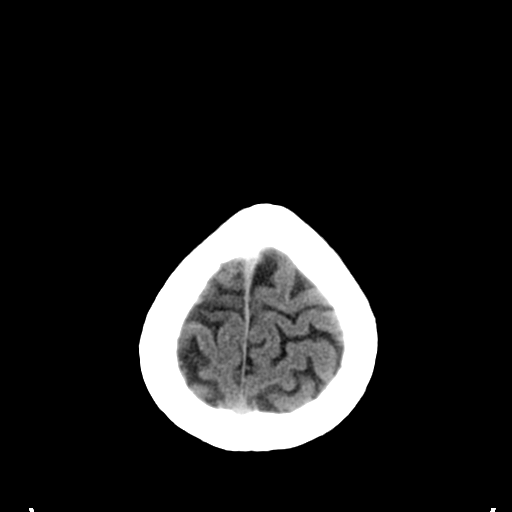
[im 32/36  brain]
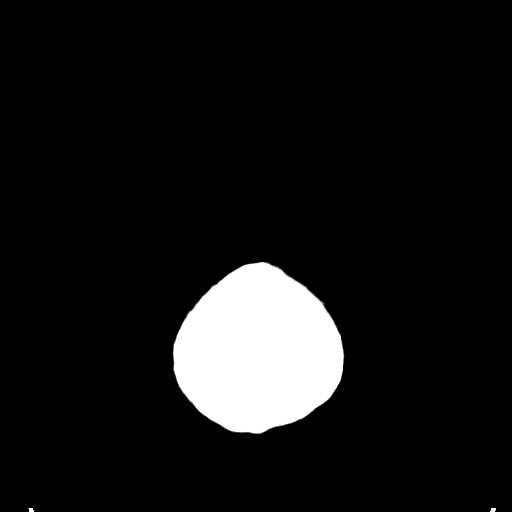
[im 34/36  brain]
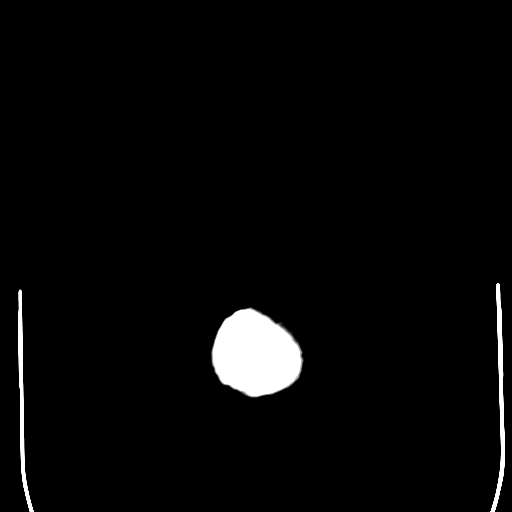

[16 of 30 positions shown; findings below may reference images not displayed]

FINDINGS: No acute intracranial abnormality is present.
Specifically, there is no evidence for acute infarct, hemorrhage,
mass, hydrocephalus, or extra-axial fluid collection.  The
paranasal sinuses and mastoid air cells are clear.  The globes and
orbits are intact.  The osseous skull is intact.
IMPRESSION: Negative CT of the head.

## 2012-11-12 ENCOUNTER — Encounter: Payer: Self-pay | Admitting: Internal Medicine

## 2012-11-20 ENCOUNTER — Telehealth: Payer: Self-pay | Admitting: Internal Medicine

## 2012-11-20 NOTE — Telephone Encounter (Signed)
Received 2 pages, [facility], sent to Dr. Yetta Barre. 11/20/12/ss

## 2012-12-18 ENCOUNTER — Ambulatory Visit: Payer: Self-pay | Admitting: Internal Medicine

## 2012-12-25 ENCOUNTER — Ambulatory Visit: Payer: Self-pay | Admitting: Internal Medicine

## 2013-02-07 ENCOUNTER — Other Ambulatory Visit: Payer: Self-pay | Admitting: Internal Medicine

## 2013-02-15 ENCOUNTER — Encounter: Payer: Self-pay | Admitting: Internal Medicine

## 2013-02-15 ENCOUNTER — Other Ambulatory Visit (INDEPENDENT_AMBULATORY_CARE_PROVIDER_SITE_OTHER): Payer: Self-pay

## 2013-02-15 ENCOUNTER — Ambulatory Visit (INDEPENDENT_AMBULATORY_CARE_PROVIDER_SITE_OTHER): Payer: Self-pay | Admitting: Internal Medicine

## 2013-02-15 VITALS — BP 126/82 | HR 105 | Temp 97.5°F | Resp 20 | Wt 197.8 lb

## 2013-02-15 DIAGNOSIS — E1129 Type 2 diabetes mellitus with other diabetic kidney complication: Secondary | ICD-10-CM

## 2013-02-15 DIAGNOSIS — R3129 Other microscopic hematuria: Secondary | ICD-10-CM

## 2013-02-15 DIAGNOSIS — M542 Cervicalgia: Secondary | ICD-10-CM

## 2013-02-15 DIAGNOSIS — E781 Pure hyperglyceridemia: Secondary | ICD-10-CM

## 2013-02-15 DIAGNOSIS — I10 Essential (primary) hypertension: Secondary | ICD-10-CM

## 2013-02-15 DIAGNOSIS — B1710 Acute hepatitis C without hepatic coma: Secondary | ICD-10-CM

## 2013-02-15 DIAGNOSIS — E1165 Type 2 diabetes mellitus with hyperglycemia: Secondary | ICD-10-CM

## 2013-02-15 DIAGNOSIS — E119 Type 2 diabetes mellitus without complications: Secondary | ICD-10-CM

## 2013-02-15 DIAGNOSIS — J449 Chronic obstructive pulmonary disease, unspecified: Secondary | ICD-10-CM

## 2013-02-15 DIAGNOSIS — M545 Low back pain: Secondary | ICD-10-CM

## 2013-02-15 DIAGNOSIS — G894 Chronic pain syndrome: Secondary | ICD-10-CM

## 2013-02-15 DIAGNOSIS — E785 Hyperlipidemia, unspecified: Secondary | ICD-10-CM

## 2013-02-15 LAB — URINALYSIS, ROUTINE W REFLEX MICROSCOPIC
Leukocytes, UA: NEGATIVE
Specific Gravity, Urine: 1.025 (ref 1.000–1.030)
Urobilinogen, UA: 0.2 (ref 0.0–1.0)

## 2013-02-15 LAB — LIPID PANEL
Total CHOL/HDL Ratio: 6
VLDL: 207.4 mg/dL — ABNORMAL HIGH (ref 0.0–40.0)

## 2013-02-15 LAB — COMPREHENSIVE METABOLIC PANEL
ALT: 63 U/L — ABNORMAL HIGH (ref 0–53)
AST: 37 U/L (ref 0–37)
Albumin: 3.6 g/dL (ref 3.5–5.2)
Alkaline Phosphatase: 96 U/L (ref 39–117)
Potassium: 4.5 mEq/L (ref 3.5–5.1)
Sodium: 133 mEq/L — ABNORMAL LOW (ref 135–145)
Total Protein: 7.3 g/dL (ref 6.0–8.3)

## 2013-02-15 MED ORDER — OLMESARTAN-AMLODIPINE-HCTZ 40-5-12.5 MG PO TABS: 1.0000 | ORAL_TABLET | Freq: Every day | ORAL | Status: DC

## 2013-02-15 MED ORDER — HYDROCODONE-ACETAMINOPHEN 10-325 MG PO TABS: 1.0000 | ORAL_TABLET | Freq: Four times a day (QID) | ORAL | Status: DC | PRN

## 2013-02-15 NOTE — Assessment & Plan Note (Signed)
He tells me that he is getting pain relief with the current regimen

## 2013-02-15 NOTE — Progress Notes (Signed)
Subjective:    Patient ID: Alvin Figueroa, male    DOB: 22-Jan-1966, 47 y.o.   MRN: [id number]  Diabetes He presents for his follow-up diabetic visit. He has type 2 diabetes mellitus. His disease course has been stable. There are no hypoglycemic associated symptoms. Pertinent negatives for hypoglycemia include no dizziness or tremors. Associated symptoms include polyuria. Pertinent negatives for diabetes include no blurred vision, no chest pain, no fatigue, no foot paresthesias, no foot ulcerations, no polydipsia, no polyphagia, no visual change, no weakness and no weight loss. There are no hypoglycemic complications. Symptoms are stable. There are no diabetic complications. Current diabetic treatment includes oral agent (triple therapy) and insulin injections. He is compliant with treatment some of the time. His weight is stable. He is following a generally healthy diet. Meal planning includes avoidance of concentrated sweets. He has not had a previous visit with a dietician. He participates in exercise intermittently. His breakfast blood glucose range is generally 90-110 mg/dl. His lunch blood glucose range is generally 110-130 mg/dl. His dinner blood glucose range is generally 140-180 mg/dl. His highest blood glucose is 140-180 mg/dl. His overall blood glucose range is 140-180 mg/dl. An ACE inhibitor/angiotensin II receptor blocker is being taken. He does not see a podiatrist.Eye exam is not current.      Review of Systems  Constitutional: Negative.  Negative for fever, chills, weight loss, diaphoresis, activity change, appetite change, fatigue and unexpected weight change.  HENT: Positive for neck pain. Negative for facial swelling and neck stiffness.   Eyes: Negative.  Negative for blurred vision.  Respiratory: Negative.  Negative for cough, chest tightness, shortness of breath, wheezing and stridor.   Cardiovascular: Negative.  Negative for chest pain, palpitations and leg swelling.   Gastrointestinal: Negative for nausea, vomiting, abdominal pain, diarrhea, constipation and blood in stool.  Endocrine: Positive for polyuria. Negative for polydipsia and polyphagia.  Musculoskeletal: Positive for back pain. Negative for myalgias, joint swelling, arthralgias and gait problem.  Skin: Negative.   Allergic/Immunologic: Negative.   Neurological: Negative.  Negative for dizziness, tremors, weakness, light-headedness and numbness.  Hematological: Negative.  Negative for adenopathy. Does not bruise/bleed easily.  Psychiatric/Behavioral: Negative.        Objective:   Physical Exam  Vitals reviewed. Constitutional: He is oriented to person, place, and time. He appears well-developed and well-nourished. No distress.  HENT:  Head: Normocephalic and atraumatic.  Mouth/Throat: Oropharynx is clear and moist. No oropharyngeal exudate.  Eyes: Conjunctivae are normal. Right eye exhibits no discharge. Left eye exhibits no discharge. No scleral icterus.  Neck: Normal range of motion. Neck supple. No JVD present. No tracheal deviation present. No thyromegaly present.  Cardiovascular: Normal rate, regular rhythm, normal heart sounds and intact distal pulses.  Exam reveals no gallop and no friction rub.   No murmur heard. Pulmonary/Chest: Effort normal and breath sounds normal. No stridor. No respiratory distress. He has no wheezes. He has no rales. He exhibits no tenderness.  Abdominal: Soft. Bowel sounds are normal. He exhibits no distension and no mass. There is no tenderness. There is no rebound and no guarding.  Musculoskeletal: Normal range of motion. He exhibits no edema and no tenderness.       Cervical back: Normal. He exhibits normal range of motion, no tenderness, no bony tenderness, no swelling, no edema, no deformity and no spasm.       Lumbar back: Normal. He exhibits normal range of motion, no tenderness, no bony tenderness and no swelling.  Lymphadenopathy:  He has no  cervical adenopathy.  Neurological: He is oriented to person, place, and time.  Skin: Skin is warm and dry. No rash noted. He is not diaphoretic. No erythema. No pallor.  Psychiatric: He has a normal mood and affect. His behavior is normal. Judgment and thought content normal.     Lab Results  Component Value Date   WBC 9.7 10/16/2012   HGB 16.1 10/16/2012   HCT 45.3 10/16/2012   PLT 233.0 10/16/2012   GLUCOSE 370* 10/16/2012   CHOL 237* 10/16/2012   TRIG 640.0 Triglyceride is over 400; calculations on Lipids are invalid.* 10/16/2012   HDL 40.40 10/16/2012   LDLDIRECT 92.8 10/16/2012   ALT 51 10/16/2012   AST 30 10/16/2012   NA 133* 10/16/2012   K 4.2 10/16/2012   CL 99 10/16/2012   CREATININE 1.5 10/16/2012   BUN 20 10/16/2012   CO2 25 10/16/2012   TSH 1.33 10/16/2012   HGBA1C 8.2* 10/16/2012       Assessment & Plan:

## 2013-02-15 NOTE — Patient Instructions (Signed)
Type 2 Diabetes Mellitus, Adult Type 2 diabetes mellitus, often simply referred to as type 2 diabetes, is a long-lasting (chronic) disease. In type 2 diabetes, the pancreas does not make enough insulin (a hormone), the cells are less responsive to the insulin that is made (insulin resistance), or both. Normally, insulin moves sugars from food into the tissue cells. The tissue cells use the sugars for energy. The lack of insulin or the lack of normal response to insulin causes excess sugars to build up in the blood instead of going into the tissue cells. As a result, high blood sugar (hyperglycemia) develops. The effect of high sugar (glucose) levels can cause many complications. Type 2 diabetes was also previously called adult-onset diabetes but it can occur at any age.  RISK FACTORS  A person is predisposed to developing type 2 diabetes if someone in the family has the disease and also has one or more of the following primary risk factors:  Overweight.  An inactive lifestyle.  A history of consistently eating high-calorie foods. Maintaining a normal weight and regular physical activity can reduce the chance of developing type 2 diabetes. SYMPTOMS  A person with type 2 diabetes may not show symptoms initially. The symptoms of type 2 diabetes appear slowly. The symptoms include:  Increased thirst (polydipsia).  Increased urination (polyuria).  Increased urination during the night (nocturia).  Weight loss. This weight loss may be rapid.  Frequent, recurring infections.  Tiredness (fatigue).  Weakness.  Vision changes, such as blurred vision.  Fruity smell to your breath.  Abdominal pain.  Nausea or vomiting.  Cuts or bruises which are slow to heal.  Tingling or numbness in the hands or feet. DIAGNOSIS Type 2 diabetes is frequently not diagnosed until complications of diabetes are present. Type 2 diabetes is diagnosed when symptoms or complications are present and when blood  glucose levels are increased. Your blood glucose level may be checked by one or more of the following blood tests:  A fasting blood glucose test. You will not be allowed to eat for at least 8 hours before a blood sample is taken.  A random blood glucose test. Your blood glucose is checked at any time of the day regardless of when you ate.  A hemoglobin A1c blood glucose test. A hemoglobin A1c test provides information about blood glucose control over the previous 3 months.  An oral glucose tolerance test (OGTT). Your blood glucose is measured after you have not eaten (fasted) for 2 hours and then after you drink a glucose-containing beverage. TREATMENT   You may need to take insulin or diabetes medicine daily to keep blood glucose levels in the desired range.  You will need to match insulin dosing with exercise and healthy food choices. The treatment goal is to maintain the before meal blood sugar (preprandial glucose) level at 70 130 mg/dL. HOME CARE INSTRUCTIONS   Have your hemoglobin A1c level checked twice a year.  Perform daily blood glucose monitoring as directed by your caregiver.  Monitor urine ketones when you are ill and as directed by your caregiver.  Take your diabetes medicine or insulin as directed by your caregiver to maintain your blood glucose levels in the desired range.  Never run out of diabetes medicine or insulin. It is needed every day.  Adjust insulin based on your intake of carbohydrates. Carbohydrates can raise blood glucose levels but need to be included in your diet. Carbohydrates provide vitamins, minerals, and fiber which are an essential part of   a healthy diet. Carbohydrates are found in fruits, vegetables, whole grains, dairy products, legumes, and foods containing added sugars.    Eat healthy foods. Alternate 3 meals with 3 snacks.  Lose weight if overweight.  Carry a medical alert card or wear your medical alert jewelry.  Carry a 15 gram  carbohydrate snack with you at all times to treat low blood glucose (hypoglycemia). Some examples of 15 gram carbohydrate snacks include:  Glucose tablets, 3 or 4   Glucose gel, 15 gram tube  Raisins, 2 tablespoons (24 grams)  Jelly beans, 6  Animal crackers, 8  Regular pop, 4 ounces (120 mL)  Gummy treats, 9  Recognize hypoglycemia. Hypoglycemia occurs with blood glucose levels of 70 mg/dL and below. The risk for hypoglycemia increases when fasting or skipping meals, during or after intense exercise, and during sleep. Hypoglycemia symptoms can include:  Tremors or shakes.  Decreased ability to concentrate.  Sweating.  Increased heart rate.  Headache.  Dry mouth.  Hunger.  Irritability.  Anxiety.  Restless sleep.  Altered speech or coordination.  Confusion.  Treat hypoglycemia promptly. If you are alert and able to safely swallow, follow the 15:15 rule:  Take 15 20 grams of rapid-acting glucose or carbohydrate. Rapid-acting options include glucose gel, glucose tablets, or 4 ounces (120 mL) of fruit juice, regular soda, or low fat milk.  Check your blood glucose level 15 minutes after taking the glucose.  Take 15 20 grams more of glucose if the repeat blood glucose level is still 70 mg/dL or below.  Eat a meal or snack within 1 hour once blood glucose levels return to normal.    Be alert to polyuria and polydipsia which are early signs of hyperglycemia. An early awareness of hyperglycemia allows for prompt treatment. Treat hyperglycemia as directed by your caregiver.  Engage in at least 150 minutes of moderate-intensity physical activity a week, spread over at least 3 days of the week or as directed by your caregiver. In addition, you should engage in resistance exercise at least 2 times a week or as directed by your caregiver.  Adjust your medicine and food intake as needed if you start a new exercise or sport.  Follow your sick day plan at any time you  are unable to eat or drink as usual.  Avoid tobacco use.  Limit alcohol intake to no more than 1 drink per day for nonpregnant women and 2 drinks per day for men. You should drink alcohol only when you are also eating food. Talk with your caregiver whether alcohol is safe for you. Tell your caregiver if you drink alcohol several times a week.  Follow up with your caregiver regularly.  Schedule an eye exam soon after the diagnosis of type 2 diabetes and then annually.  Perform daily skin and foot care. Examine your skin and feet daily for cuts, bruises, redness, nail problems, bleeding, blisters, or sores. A foot exam by a caregiver should be done annually.  Brush your teeth and gums at least twice a day and floss at least once a day. Follow up with your dentist regularly.  Share your diabetes management plan with your workplace or school.  Stay up-to-date with immunizations.  Learn to manage stress.  Obtain ongoing diabetes education and support as needed.  Participate in, or seek rehabilitation as needed to maintain or improve independence and quality of life. Request a physical or occupational therapy referral if you are having foot or hand numbness or difficulties with grooming,   dressing, eating, or physical activity. SEEK MEDICAL CARE IF:   You are unable to eat food or drink fluids for more than 6 hours.  You have nausea and vomiting for more than 6 hours.  Your blood glucose level is over 240 mg/dL.  There is a change in mental status.  You develop an additional serious illness.  You have diarrhea for more than 6 hours.  You have been sick or have had a fever for a couple of days and are not getting better.  You have pain during any physical activity.  SEEK IMMEDIATE MEDICAL CARE IF:  You have difficulty breathing.  You have moderate to large ketone levels. MAKE SURE YOU:  Understand these instructions.  Will watch your condition.  Will get help right away if  you are not doing well or get worse. Document Released: 08/05/2005 Document Revised: 04/29/2012 Document Reviewed: 03/03/2012 Exit[employer] Patient Information 2014 [employer].  

## 2013-02-15 NOTE — Assessment & Plan Note (Signed)
His BP is well controlled I will check his lytes and renal function today 

## 2013-02-15 NOTE — Assessment & Plan Note (Signed)
I will check his A1C and will monitor his renal function 

## 2013-02-15 NOTE — Assessment & Plan Note (Signed)
I will recheck his UA today 

## 2013-02-15 NOTE — Assessment & Plan Note (Signed)
I have asked him to be seen in the Hep C clinic to see if he needs to be or is a candidate for one of the newer treatments for Hep C

## 2013-02-15 NOTE — Assessment & Plan Note (Signed)
He is doing well on pravachol 

## 2013-03-08 ENCOUNTER — Other Ambulatory Visit: Payer: Self-pay | Admitting: Internal Medicine

## 2013-03-08 NOTE — Telephone Encounter (Signed)
Ok to refill? Last OV 6.30.14 Last filled 4.25.13

## 2013-04-18 ENCOUNTER — Other Ambulatory Visit: Payer: Self-pay | Admitting: Internal Medicine

## 2013-06-17 ENCOUNTER — Ambulatory Visit (INDEPENDENT_AMBULATORY_CARE_PROVIDER_SITE_OTHER): Payer: Self-pay

## 2013-06-17 ENCOUNTER — Ambulatory Visit (INDEPENDENT_AMBULATORY_CARE_PROVIDER_SITE_OTHER): Payer: Self-pay | Admitting: Internal Medicine

## 2013-06-17 ENCOUNTER — Encounter: Payer: Self-pay | Admitting: Internal Medicine

## 2013-06-17 VITALS — BP 140/86 | HR 90 | Temp 97.9°F | Resp 16 | Ht 69.0 in | Wt 195.0 lb

## 2013-06-17 DIAGNOSIS — R3129 Other microscopic hematuria: Secondary | ICD-10-CM

## 2013-06-17 DIAGNOSIS — F1410 Cocaine abuse, uncomplicated: Secondary | ICD-10-CM

## 2013-06-17 DIAGNOSIS — E1165 Type 2 diabetes mellitus with hyperglycemia: Secondary | ICD-10-CM

## 2013-06-17 DIAGNOSIS — E781 Pure hyperglyceridemia: Secondary | ICD-10-CM

## 2013-06-17 DIAGNOSIS — M545 Low back pain: Secondary | ICD-10-CM

## 2013-06-17 DIAGNOSIS — I10 Essential (primary) hypertension: Secondary | ICD-10-CM

## 2013-06-17 DIAGNOSIS — G894 Chronic pain syndrome: Secondary | ICD-10-CM

## 2013-06-17 DIAGNOSIS — E1129 Type 2 diabetes mellitus with other diabetic kidney complication: Secondary | ICD-10-CM

## 2013-06-17 DIAGNOSIS — Z23 Encounter for immunization: Secondary | ICD-10-CM

## 2013-06-17 DIAGNOSIS — M542 Cervicalgia: Secondary | ICD-10-CM

## 2013-06-17 LAB — CBC WITH DIFFERENTIAL/PLATELET
Basophils Absolute: 0 10*3/uL (ref 0.0–0.1)
Basophils Relative: 0.5 % (ref 0.0–3.0)
Eosinophils Absolute: 0.2 10*3/uL (ref 0.0–0.7)
Eosinophils Relative: 1.9 % (ref 0.0–5.0)
HCT: 47.5 % (ref 39.0–52.0)
Hemoglobin: 16.8 g/dL (ref 13.0–17.0)
Lymphocytes Relative: 20.1 % (ref 12.0–46.0)
Lymphs Abs: 1.9 10*3/uL (ref 0.7–4.0)
MCHC: 35.4 g/dL (ref 30.0–36.0)
MCV: 89.3 fl (ref 78.0–100.0)
Monocytes Absolute: 0.6 10*3/uL (ref 0.1–1.0)
Monocytes Relative: 6 % (ref 3.0–12.0)
Neutro Abs: 6.9 10*3/uL (ref 1.4–7.7)
Neutrophils Relative %: 71.5 % (ref 43.0–77.0)
Platelets: 211 10*3/uL (ref 150.0–400.0)
RBC: 5.32 Mil/uL (ref 4.22–5.81)
RDW: 12.9 % (ref 11.5–14.6)
WBC: 9.6 10*3/uL (ref 4.5–10.5)

## 2013-06-17 LAB — BASIC METABOLIC PANEL
BUN: 13 mg/dL (ref 6–23)
Chloride: 99 mEq/L (ref 96–112)
GFR: 63.33 mL/min (ref >60.00)
Potassium: 3.8 mEq/L (ref 3.5–5.1)
Sodium: 131 mEq/L — ABNORMAL LOW (ref 135–145)

## 2013-06-17 LAB — LIPID PANEL
Cholesterol: 410 mg/dL — ABNORMAL HIGH (ref 0–200)
HDL: 38.6 mg/dL — ABNORMAL LOW (ref >39.00)

## 2013-06-17 LAB — URINALYSIS, ROUTINE W REFLEX MICROSCOPIC
Bilirubin Urine: NEGATIVE
Ketones, ur: NEGATIVE
Leukocytes, UA: NEGATIVE
Nitrite: NEGATIVE
Urobilinogen, UA: 0.2 (ref 0.0–1.0)
pH: 6 (ref 5.0–8.0)

## 2013-06-17 LAB — HEMOGLOBIN A1C: Hgb A1c MFr Bld: 11.7 % — ABNORMAL HIGH (ref 4.6–6.5)

## 2013-06-17 MED ORDER — HYDROCODONE-ACETAMINOPHEN 10-325 MG PO TABS: 1.0000 | ORAL_TABLET | Freq: Four times a day (QID) | ORAL | Status: DC | PRN

## 2013-06-17 MED ORDER — INSULIN LISPRO PROT & LISPRO (75-25 MIX) 100 UNIT/ML KWIKPEN: 40.0000 [IU] | PEN_INJECTOR | Freq: Two times a day (BID) | SUBCUTANEOUS | Status: DC

## 2013-06-17 NOTE — Assessment & Plan Note (Signed)
He tells me that lantus is too expensive so I gave him samples of Humalog Mix, we'll see how that works Today I will check his A1C and will monitor his renal function

## 2013-06-17 NOTE — Assessment & Plan Note (Signed)
His BP is adequately well controlled I will check his lytes and renal function today

## 2013-06-17 NOTE — Assessment & Plan Note (Signed)
I will recheck his UA today 

## 2013-06-17 NOTE — Assessment & Plan Note (Signed)
I will recheck his UDS to see if he is abusing cocaine again

## 2013-06-17 NOTE — Assessment & Plan Note (Addendum)
I have advanced his insulin dose He agrees to work on his caloric intake as well as abstaining from EtOH Will recheck his FLP today

## 2013-06-17 NOTE — Progress Notes (Signed)
Subjective:    Patient ID: Alvin Figueroa, male    DOB: 11-03-1965, 47 y.o.   MRN: [id number]  Hypertension This is a chronic problem. The current episode started more than 1 year ago. The problem is unchanged. The problem is uncontrolled. Associated symptoms include neck pain (chronic,unchanged neck pain). Pertinent negatives include no anxiety, blurred vision, chest pain, headaches, malaise/fatigue, orthopnea, palpitations, peripheral edema, PND, shortness of breath or sweats. Risk factors for coronary artery disease include smoking/tobacco exposure. Past treatments include angiotensin blockers, calcium channel blockers and diuretics. The current treatment provides moderate improvement. Compliance problems include medication cost, exercise and diet.  Hypertensive end-organ damage includes kidney disease. Identifiable causes of hypertension include chronic renal disease.      Review of Systems  Constitutional: Negative.  Negative for malaise/fatigue.  HENT: Negative.  Negative for trouble swallowing and voice change.   Eyes: Negative.  Negative for blurred vision.  Respiratory: Negative.  Negative for apnea, cough, choking, chest tightness, shortness of breath, wheezing and stridor.   Cardiovascular: Negative.  Negative for chest pain, palpitations, orthopnea, leg swelling and PND.  Gastrointestinal: Negative.  Negative for nausea, vomiting, abdominal pain, diarrhea and constipation.  Endocrine: Negative.  Negative for polydipsia, polyphagia and polyuria.  Genitourinary: Negative.  Negative for frequency, hematuria, decreased urine volume and difficulty urinating.  Musculoskeletal: Positive for back pain and neck pain (chronic,unchanged neck pain). Negative for arthralgias, gait problem, joint swelling, myalgias and neck stiffness.  Skin: Negative.   Allergic/Immunologic: Negative.   Neurological: Negative.  Negative for dizziness, tremors, weakness, light-headedness and headaches.   Hematological: Negative.  Negative for adenopathy. Does not bruise/bleed easily.  Psychiatric/Behavioral: Negative.        Objective:   Physical Exam  Vitals reviewed. Constitutional: He is oriented to person, place, and time. He appears well-developed and well-nourished. No distress.  HENT:  Head: Normocephalic and atraumatic.  Mouth/Throat: Oropharynx is clear and moist. No oropharyngeal exudate.  Eyes: Conjunctivae are normal. Right eye exhibits no discharge. Left eye exhibits no discharge. No scleral icterus.  Neck: Normal range of motion. Neck supple. No JVD present. No tracheal deviation present. No thyromegaly present.  Cardiovascular: Normal rate, regular rhythm, normal heart sounds and intact distal pulses.  Exam reveals no gallop and no friction rub.   No murmur heard. Pulmonary/Chest: Effort normal and breath sounds normal. No stridor. No respiratory distress. He has no wheezes. He has no rales. He exhibits no tenderness.  Abdominal: Soft. Bowel sounds are normal. He exhibits no distension and no mass. There is no tenderness. There is no rebound and no guarding.  Musculoskeletal: Normal range of motion. He exhibits no edema and no tenderness.       Cervical back: Normal. He exhibits normal range of motion, no tenderness, no bony tenderness, no swelling, no edema, no deformity, no laceration, no pain, no spasm and normal pulse.       Lumbar back: Normal. He exhibits normal range of motion, no tenderness, no bony tenderness, no swelling, no edema and no deformity.  Lymphadenopathy:    He has no cervical adenopathy.  Neurological: He is oriented to person, place, and time.  Skin: Skin is warm and dry. No rash noted. He is not diaphoretic. No erythema. No pallor.     Lab Results  Component Value Date   WBC 9.7 10/16/2012   HGB 16.1 10/16/2012   HCT 45.3 10/16/2012   PLT 233.0 10/16/2012   GLUCOSE 310* 02/15/2013   CHOL 272* 02/15/2013   TRIG  1037.0* 02/15/2013   HDL 47.30  02/15/2013   LDLDIRECT 79.3 02/15/2013   ALT 63* 02/15/2013   AST 37 02/15/2013   NA 133* 02/15/2013   K 4.5 02/15/2013   CL 100 02/15/2013   CREATININE 1.7* 02/15/2013   BUN 34* 02/15/2013   CO2 22 02/15/2013   TSH 1.33 10/16/2012   HGBA1C 11.5* 02/15/2013       Assessment & Plan:

## 2013-06-17 NOTE — Assessment & Plan Note (Signed)
He will cont the current meds for pain 

## 2013-06-17 NOTE — Patient Instructions (Signed)
Type 2 Diabetes Mellitus, Adult Type 2 diabetes mellitus, often simply referred to as type 2 diabetes, is a long-lasting (chronic) disease. In type 2 diabetes, the pancreas does not make enough insulin (a hormone), the cells are less responsive to the insulin that is made (insulin resistance), or both. Normally, insulin moves sugars from food into the tissue cells. The tissue cells use the sugars for energy. The lack of insulin or the lack of normal response to insulin causes excess sugars to build up in the blood instead of going into the tissue cells. As a result, high blood sugar (hyperglycemia) develops. The effect of high sugar (glucose) levels can cause many complications. Type 2 diabetes was also previously called adult-onset diabetes but it can occur at any age.  RISK FACTORS  A person is predisposed to developing type 2 diabetes if someone in the family has the disease and also has one or more of the following primary risk factors:  Overweight.  An inactive lifestyle.  A history of consistently eating high-calorie foods. Maintaining a normal weight and regular physical activity can reduce the chance of developing type 2 diabetes. SYMPTOMS  A person with type 2 diabetes may not show symptoms initially. The symptoms of type 2 diabetes appear slowly. The symptoms include:  Increased thirst (polydipsia).  Increased urination (polyuria).  Increased urination during the night (nocturia).  Weight loss. This weight loss may be rapid.  Frequent, recurring infections.  Tiredness (fatigue).  Weakness.  Vision changes, such as blurred vision.  Fruity smell to your breath.  Abdominal pain.  Nausea or vomiting.  Cuts or bruises which are slow to heal.  Tingling or numbness in the hands or feet. DIAGNOSIS Type 2 diabetes is frequently not diagnosed until complications of diabetes are present. Type 2 diabetes is diagnosed when symptoms or complications are present and when blood  glucose levels are increased. Your blood glucose level may be checked by one or more of the following blood tests:  A fasting blood glucose test. You will not be allowed to eat for at least 8 hours before a blood sample is taken.  A random blood glucose test. Your blood glucose is checked at any time of the day regardless of when you ate.  A hemoglobin A1c blood glucose test. A hemoglobin A1c test provides information about blood glucose control over the previous 3 months.  An oral glucose tolerance test (OGTT). Your blood glucose is measured after you have not eaten (fasted) for 2 hours and then after you drink a glucose-containing beverage. TREATMENT   You may need to take insulin or diabetes medicine daily to keep blood glucose levels in the desired range.  You will need to match insulin dosing with exercise and healthy food choices. The treatment goal is to maintain the before meal blood sugar (preprandial glucose) level at 70 130 mg/dL. HOME CARE INSTRUCTIONS   Have your hemoglobin A1c level checked twice a year.  Perform daily blood glucose monitoring as directed by your caregiver.  Monitor urine ketones when you are ill and as directed by your caregiver.  Take your diabetes medicine or insulin as directed by your caregiver to maintain your blood glucose levels in the desired range.  Never run out of diabetes medicine or insulin. It is needed every day.  Adjust insulin based on your intake of carbohydrates. Carbohydrates can raise blood glucose levels but need to be included in your diet. Carbohydrates provide vitamins, minerals, and fiber which are an essential part of   a healthy diet. Carbohydrates are found in fruits, vegetables, whole grains, dairy products, legumes, and foods containing added sugars.    Eat healthy foods. Alternate 3 meals with 3 snacks.  Lose weight if overweight.  Carry a medical alert card or wear your medical alert jewelry.  Carry a 15 gram  carbohydrate snack with you at all times to treat low blood glucose (hypoglycemia). Some examples of 15 gram carbohydrate snacks include:  Glucose tablets, 3 or 4   Glucose gel, 15 gram tube  Raisins, 2 tablespoons (24 grams)  Jelly beans, 6  Animal crackers, 8  Regular pop, 4 ounces (120 mL)  Gummy treats, 9  Recognize hypoglycemia. Hypoglycemia occurs with blood glucose levels of 70 mg/dL and below. The risk for hypoglycemia increases when fasting or skipping meals, during or after intense exercise, and during sleep. Hypoglycemia symptoms can include:  Tremors or shakes.  Decreased ability to concentrate.  Sweating.  Increased heart rate.  Headache.  Dry mouth.  Hunger.  Irritability.  Anxiety.  Restless sleep.  Altered speech or coordination.  Confusion.  Treat hypoglycemia promptly. If you are alert and able to safely swallow, follow the 15:15 rule:  Take 15 20 grams of rapid-acting glucose or carbohydrate. Rapid-acting options include glucose gel, glucose tablets, or 4 ounces (120 mL) of fruit juice, regular soda, or low fat milk.  Check your blood glucose level 15 minutes after taking the glucose.  Take 15 20 grams more of glucose if the repeat blood glucose level is still 70 mg/dL or below.  Eat a meal or snack within 1 hour once blood glucose levels return to normal.    Be alert to polyuria and polydipsia which are early signs of hyperglycemia. An early awareness of hyperglycemia allows for prompt treatment. Treat hyperglycemia as directed by your caregiver.  Engage in at least 150 minutes of moderate-intensity physical activity a week, spread over at least 3 days of the week or as directed by your caregiver. In addition, you should engage in resistance exercise at least 2 times a week or as directed by your caregiver.  Adjust your medicine and food intake as needed if you start a new exercise or sport.  Follow your sick day plan at any time you  are unable to eat or drink as usual.  Avoid tobacco use.  Limit alcohol intake to no more than 1 drink per day for nonpregnant women and 2 drinks per day for men. You should drink alcohol only when you are also eating food. Talk with your caregiver whether alcohol is safe for you. Tell your caregiver if you drink alcohol several times a week.  Follow up with your caregiver regularly.  Schedule an eye exam soon after the diagnosis of type 2 diabetes and then annually.  Perform daily skin and foot care. Examine your skin and feet daily for cuts, bruises, redness, nail problems, bleeding, blisters, or sores. A foot exam by a caregiver should be done annually.  Brush your teeth and gums at least twice a day and floss at least once a day. Follow up with your dentist regularly.  Share your diabetes management plan with your workplace or school.  Stay up-to-date with immunizations.  Learn to manage stress.  Obtain ongoing diabetes education and support as needed.  Participate in, or seek rehabilitation as needed to maintain or improve independence and quality of life. Request a physical or occupational therapy referral if you are having foot or hand numbness or difficulties with grooming,   dressing, eating, or physical activity. SEEK MEDICAL CARE IF:   You are unable to eat food or drink fluids for more than 6 hours.  You have nausea and vomiting for more than 6 hours.  Your blood glucose level is over 240 mg/dL.  There is a change in mental status.  You develop an additional serious illness.  You have diarrhea for more than 6 hours.  You have been sick or have had a fever for a couple of days and are not getting better.  You have pain during any physical activity.  SEEK IMMEDIATE MEDICAL CARE IF:  You have difficulty breathing.  You have moderate to large ketone levels. MAKE SURE YOU:  Understand these instructions.  Will watch your condition.  Will get help right away if  you are not doing well or get worse. Document Released: 08/05/2005 Document Revised: 04/29/2012 Document Reviewed: 03/03/2012 Exit[employer] Patient Information 2014 [employer].  

## 2013-06-18 ENCOUNTER — Ambulatory Visit: Payer: Self-pay | Admitting: Internal Medicine

## 2013-06-18 ENCOUNTER — Encounter: Payer: Self-pay | Admitting: Internal Medicine

## 2013-06-18 LAB — DRUGS OF ABUSE SCREEN W/O ALC, ROUTINE URINE
Amphetamine Screen, Ur: NEGATIVE
Barbiturate Quant, Ur: NEGATIVE
Benzodiazepines.: POSITIVE — AB
Cocaine Metabolites: NEGATIVE
Creatinine,U: 62.1 mg/dL
Marijuana Metabolite: NEGATIVE
Methadone: NEGATIVE
Phencyclidine (PCP): NEGATIVE

## 2013-06-18 NOTE — Addendum Note (Signed)
Addended by: Etta Grandchild on: 06/18/2013 07:43 AM   Modules accepted: Orders, Medications

## 2013-06-21 LAB — BENZODIAZEPINES (GC/LC/MS), URINE
Clonazepam metabolite (GC/LC/MS), ur confirm: NEGATIVE ng/mL
Diazepam (GC/LC/MS), ur confirm: NEGATIVE ng/mL
Estazolam (GC/LC/MS), ur confirm: NEGATIVE ng/mL
Flunitrazepam metabolite (GC/LC/MS), ur confirm: NEGATIVE ng/mL
Flurazepam metabolite (GC/LC/MS), ur confirm: NEGATIVE ng/mL
Lorazepam (GC/LC/MS), ur confirm: NEGATIVE ng/mL
Nordiazepam (GC/LC/MS), ur confirm: 66 ng/mL
Oxazepam (GC/LC/MS), ur confirm: 119 ng/mL
Temazepam (GC/LC/MS), ur confirm: 163 ng/mL
Triazolam metabolite (GC/LC/MS), ur confirm: NEGATIVE ng/mL

## 2013-07-08 ENCOUNTER — Other Ambulatory Visit: Payer: Self-pay | Admitting: Internal Medicine

## 2013-09-17 ENCOUNTER — Ambulatory Visit: Payer: Self-pay | Admitting: Internal Medicine

## 2013-09-24 ENCOUNTER — Ambulatory Visit: Payer: Self-pay | Admitting: Internal Medicine

## 2013-09-27 ENCOUNTER — Telehealth: Payer: Self-pay | Admitting: *Deleted

## 2013-09-27 NOTE — Telephone Encounter (Signed)
Spouse phoned requesting a current medication list for patient to complete insurance forms.  Attempted to return call; no answer, no voicemail set up.  Med list printed and addressed to current mailing address and put in mail.

## 2013-09-28 ENCOUNTER — Telehealth: Payer: Self-pay | Admitting: Internal Medicine

## 2013-09-28 DIAGNOSIS — M545 Low back pain, unspecified: Secondary | ICD-10-CM

## 2013-09-28 DIAGNOSIS — G894 Chronic pain syndrome: Secondary | ICD-10-CM

## 2013-09-28 DIAGNOSIS — M542 Cervicalgia: Secondary | ICD-10-CM

## 2013-09-28 NOTE — Telephone Encounter (Signed)
Referral sent to pain clinic.

## 2013-09-28 NOTE — Telephone Encounter (Signed)
Pt wants to know what to do about his pain medicine.  If Dr. Yetta BarreJones isn't going to fill that, could he get a referral to a pain clinic or locate another doctor?

## 2013-09-29 NOTE — Telephone Encounter (Signed)
Pt is aware.  

## 2013-10-01 ENCOUNTER — Other Ambulatory Visit: Payer: Self-pay | Admitting: Internal Medicine

## 2013-11-17 ENCOUNTER — Other Ambulatory Visit: Payer: Self-pay | Admitting: *Deleted

## 2013-11-17 DIAGNOSIS — E1165 Type 2 diabetes mellitus with hyperglycemia: Secondary | ICD-10-CM

## 2013-11-17 DIAGNOSIS — E1129 Type 2 diabetes mellitus with other diabetic kidney complication: Secondary | ICD-10-CM

## 2013-11-17 DIAGNOSIS — E781 Pure hyperglyceridemia: Secondary | ICD-10-CM

## 2013-11-17 DIAGNOSIS — E119 Type 2 diabetes mellitus without complications: Secondary | ICD-10-CM

## 2013-11-17 MED ORDER — INSULIN PEN NEEDLE 31G X 8 MM MISC: Status: DC

## 2013-11-17 MED ORDER — INSULIN LISPRO PROT & LISPRO (75-25 MIX) 100 UNIT/ML KWIKPEN: 40.0000 [IU] | PEN_INJECTOR | Freq: Two times a day (BID) | SUBCUTANEOUS | Status: DC

## 2013-11-17 MED ORDER — CANAGLIFLOZIN 100 MG PO TABS: 1.0000 | ORAL_TABLET | Freq: Every day | ORAL | Status: DC

## 2014-02-01 ENCOUNTER — Ambulatory Visit: Payer: Self-pay | Admitting: Physician Assistant

## 2014-02-01 ENCOUNTER — Ambulatory Visit (INDEPENDENT_AMBULATORY_CARE_PROVIDER_SITE_OTHER): Payer: 59 | Admitting: Physician Assistant

## 2014-02-01 VITALS — BP 158/86 | HR 114 | Ht 69.0 in | Wt 195.0 lb

## 2014-02-01 DIAGNOSIS — E1129 Type 2 diabetes mellitus with other diabetic kidney complication: Secondary | ICD-10-CM

## 2014-02-01 DIAGNOSIS — J449 Chronic obstructive pulmonary disease, unspecified: Secondary | ICD-10-CM

## 2014-02-01 DIAGNOSIS — E119 Type 2 diabetes mellitus without complications: Secondary | ICD-10-CM

## 2014-02-01 DIAGNOSIS — E785 Hyperlipidemia, unspecified: Secondary | ICD-10-CM

## 2014-02-01 DIAGNOSIS — J4489 Other specified chronic obstructive pulmonary disease: Secondary | ICD-10-CM

## 2014-02-01 DIAGNOSIS — F17200 Nicotine dependence, unspecified, uncomplicated: Secondary | ICD-10-CM

## 2014-02-01 DIAGNOSIS — M542 Cervicalgia: Secondary | ICD-10-CM

## 2014-02-01 DIAGNOSIS — E781 Pure hyperglyceridemia: Secondary | ICD-10-CM

## 2014-02-01 DIAGNOSIS — I10 Essential (primary) hypertension: Secondary | ICD-10-CM

## 2014-02-01 DIAGNOSIS — G894 Chronic pain syndrome: Secondary | ICD-10-CM

## 2014-02-01 DIAGNOSIS — E1165 Type 2 diabetes mellitus with hyperglycemia: Secondary | ICD-10-CM

## 2014-02-01 MED ORDER — CANAGLIFLOZIN 100 MG PO TABS: 1.0000 | ORAL_TABLET | Freq: Every day | ORAL | Status: DC

## 2014-02-01 MED ORDER — INSULIN PEN NEEDLE 31G X 8 MM MISC: Status: DC

## 2014-02-01 MED ORDER — MELOXICAM 15 MG PO TABS: 15.0000 mg | ORAL_TABLET | Freq: Every day | ORAL | Status: AC

## 2014-02-01 MED ORDER — ALBUTEROL SULFATE 108 (90 BASE) MCG/ACT IN AEPB: 2.0000 | INHALATION_SPRAY | RESPIRATORY_TRACT | Status: AC | PRN

## 2014-02-01 MED ORDER — GLIPIZIDE 10 MG PO TABS: 10.0000 mg | ORAL_TABLET | Freq: Two times a day (BID) | ORAL | Status: DC

## 2014-02-01 MED ORDER — INSULIN LISPRO PROT & LISPRO (75-25 MIX) 100 UNIT/ML KWIKPEN: 40.0000 [IU] | PEN_INJECTOR | Freq: Two times a day (BID) | SUBCUTANEOUS | Status: DC

## 2014-02-01 MED ORDER — KETOROLAC TROMETHAMINE 60 MG/2ML IM SOLN
60.0000 mg | Freq: Once | INTRAMUSCULAR | Status: AC
  Administered 2014-02-01: 60 mg via INTRAMUSCULAR

## 2014-02-01 MED ORDER — HYDROCODONE-ACETAMINOPHEN 10-325 MG PO TABS: 1.0000 | ORAL_TABLET | Freq: Four times a day (QID) | ORAL | Status: DC | PRN

## 2014-02-01 MED ORDER — TIOTROPIUM BROMIDE MONOHYDRATE 18 MCG IN CAPS: 18.0000 ug | ORAL_CAPSULE | Freq: Every day | RESPIRATORY_TRACT | Status: DC

## 2014-02-01 MED ORDER — SILDENAFIL CITRATE 100 MG PO TABS: ORAL_TABLET | ORAL | Status: DC

## 2014-02-01 MED ORDER — OMEGA-3-ACID ETHYL ESTERS 1 G PO CAPS: 2.0000 g | ORAL_CAPSULE | Freq: Two times a day (BID) | ORAL | Status: DC

## 2014-02-01 NOTE — Patient Instructions (Signed)
Follow up in one month.  Will get scheduled for PT.

## 2014-02-02 LAB — DRUG SCREEN, URINE
AMPHETAMINE SCRN UR: NEGATIVE
Barbiturate Quant, Ur: NEGATIVE
Benzodiazepines.: NEGATIVE
Cocaine Metabolites: NEGATIVE
Creatinine,U: 40.19 mg/dL
MARIJUANA METABOLITE: NEGATIVE
Methadone: NEGATIVE
OPIATES: POSITIVE — AB
PHENCYCLIDINE (PCP): NEGATIVE
Propoxyphene: NEGATIVE

## 2014-02-02 NOTE — Progress Notes (Signed)
Subjective:    Patient ID: Alvin Figueroa, male    DOB: 03-13-1966, 48 y.o.   MRN: [id number]  HPI Pt is a 48 yo male who presents to the clinic to establish care.   .. Active Ambulatory Problems    Diagnosis Date Noted  . Type II or unspecified type diabetes mellitus with renal manifestations, uncontrolled(250.42) 07/20/2009  . HYPERLIPIDEMIA 07/20/2009  . TOBACCO USE 08/10/2009  . Chronic pain syndrome 07/20/2009  . HYPERTENSION 07/20/2009  . COPD 07/20/2009  . NECK PAIN, CHRONIC 04/09/2010  . Hep C w/o coma, chronic 10/17/2010  . Microscopic hematuria 10/18/2010  . Cocaine abuse 12/25/2010  . Low back pain 04/01/2011  . COPD with exacerbation 05/24/2011  . Hypertriglyceridemia 01/17/2012   Resolved Ambulatory Problems    Diagnosis Date Noted  . TRANSAMINASES, SERUM, ELEVATED 08/08/2010  . Acute pancreatitis 10/18/2010  . Abdominal pain, generalized 10/17/2010  . Candidal balanitis 04/01/2011  . Acute bronchitis 05/24/2011  . OE (otitis externa) 08/26/2011  . Cough 01/14/2012  . Acute bronchitis 01/14/2012  . Headache 04/13/2012   Past Medical History  Diagnosis Date  . COPD (chronic obstructive pulmonary disease)   . Type II or unspecified type diabetes mellitus without mention of complication, not stated as uncontrolled   . Hyperlipidemia   . HTN (hypertension)   . Hepatitis C    .Marland Kitchen. Past Surgical History  Procedure Laterality Date  . Inguinal hernia repair    . C-spine abscess surgery  2007    [facility]   .Marland Kitchen. History   Social History  . Marital Status: Single    Spouse Name: N/A    Number of Children: N/A  . Years of Education: N/A   Occupational History  . [employer] doctorDriver    Social History Main Topics  . Smoking status: Current Every Day Smoker -- 1.50 packs/day for 25 years    Types: Cigarettes  . Smokeless tobacco: Never Used  . Alcohol Use: 3.6 oz/week    3 Glasses of wine, 3 Cans of beer per week  . Drug Use: Yes    Special: Cocaine  .  Sexual Activity: Yes    Birth Control/ Protection: Condom   Other Topics Concern  . Not on file   Social History Narrative   Regular Exercise -  NO           .Marland Kitchen. Family History  Problem Relation Age of Onset  . Diabetes Other     1st degree relative  . Hypertension Other    Pt needs to get back on all of his medications. He has not been on because he lost his insurance.   He is very concerned about pain management as well. He has chronic neck pain. He has not been able to see anyone for help due to payment and insurance. Per pt has a disc buldge in cspine at c6 and c7. Pt ROm of neck decreased due to pain.   He was very upfront about being dismissed from Dr. Yetta BarreJones office. He stated it was because he took his grandmothers valium when it was not prescribed and was discovered on drug screen. He has in the past had problem with coccaine. He has not used in last year.   HTN- uncontrolled today not on meds. Denies any CP, palpitations.   COPD-increasing SOB being off inhalers.   DM- uncontrolled. Not checking sugars. Not on medications.     Review of Systems  All other systems reviewed and are negative.  Objective:   Physical Exam  Constitutional: He is oriented to person, place, and time. He appears well-developed and well-nourished.  Cardiovascular: Regular rhythm and normal heart sounds.   Tachycardia at 114.   Pulmonary/Chest:  Coarse breath sounds bilaterally. Scattered rhonchi heard.   Musculoskeletal:  LROM of neck to 50 degrees both sides.  Tenderness to palpation over cspine.    Neurological: He is alert and oriented to person, place, and time.  Psychiatric: He has a normal mood and affect. His behavior is normal.          Assessment & Plan:   HTN- restarted on BP medication. Recheck in 1 month.   DM- restarted on diabetic medications. Will recheck randoms in one month. Refilled glucose strips to keep log.   Hyperlipidemia- started back on  medication. Will not check LDL until been on statin for 3 months.   COPD- started back on spiriva and dulera. Rescue inhaler given. Follow up in one month.   Chronic neck pain/pain syndrome- discussed honor code and had pt sign contract. Discussed he cannot take anything not prescribed by our office. Will not refill early. Drug screen done today and repeat in one month. Hydrocodone given up to twice a day for ongoing pain. Discussed possible PT/injections/follow up in one month to discuss options.   Tobacco abuse- discussed quiting and pt is interested. Will come back to discuss steps to quiting. He has tried everything OTC would like rx.

## 2014-02-03 ENCOUNTER — Encounter: Payer: Self-pay | Admitting: Physician Assistant

## 2014-03-04 ENCOUNTER — Ambulatory Visit: Payer: 59 | Admitting: Physician Assistant

## 2014-03-04 ENCOUNTER — Ambulatory Visit (INDEPENDENT_AMBULATORY_CARE_PROVIDER_SITE_OTHER): Payer: 59 | Admitting: Physician Assistant

## 2014-03-04 ENCOUNTER — Encounter: Payer: Self-pay | Admitting: Physician Assistant

## 2014-03-04 VITALS — BP 156/84 | HR 105 | Ht 69.0 in | Wt 191.0 lb

## 2014-03-04 DIAGNOSIS — G894 Chronic pain syndrome: Secondary | ICD-10-CM

## 2014-03-04 DIAGNOSIS — E7800 Pure hypercholesterolemia, unspecified: Secondary | ICD-10-CM

## 2014-03-04 DIAGNOSIS — E1129 Type 2 diabetes mellitus with other diabetic kidney complication: Secondary | ICD-10-CM

## 2014-03-04 DIAGNOSIS — K2100 Gastro-esophageal reflux disease with esophagitis, without bleeding: Secondary | ICD-10-CM

## 2014-03-04 DIAGNOSIS — R609 Edema, unspecified: Secondary | ICD-10-CM

## 2014-03-04 DIAGNOSIS — Z79899 Other long term (current) drug therapy: Secondary | ICD-10-CM

## 2014-03-04 DIAGNOSIS — M542 Cervicalgia: Secondary | ICD-10-CM

## 2014-03-04 DIAGNOSIS — E1165 Type 2 diabetes mellitus with hyperglycemia: Secondary | ICD-10-CM

## 2014-03-04 DIAGNOSIS — R600 Localized edema: Secondary | ICD-10-CM

## 2014-03-04 MED ORDER — OMEPRAZOLE 40 MG PO CPDR: 40.0000 mg | DELAYED_RELEASE_CAPSULE | Freq: Every day | ORAL | Status: DC

## 2014-03-04 MED ORDER — HYDROCODONE-ACETAMINOPHEN 10-325 MG PO TABS: 1.0000 | ORAL_TABLET | Freq: Four times a day (QID) | ORAL | Status: DC | PRN

## 2014-03-04 MED ORDER — FUROSEMIDE 20 MG PO TABS: 20.0000 mg | ORAL_TABLET | Freq: Every day | ORAL | Status: DC

## 2014-03-04 NOTE — Patient Instructions (Signed)
Lasix 20mg  as needed for ankle/feet swelling as needed.   Omeprazole take one tablet daily in am before breakfast.

## 2014-03-04 NOTE — Progress Notes (Signed)
Subjective:    Patient ID: Alvin Figueroa, male    DOB: 01/22/1966, 48 y.o.   MRN: [id number]  HPI Pt presents to the clinic for medication refill.   He also has been having more swelling of bilateral ankle and feet. 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 Denies any redness or warmth. Not tried anything to make better. Usually best in am.   HTN- out of bp medication for last 3 days as well as pain rx. BP up today. Denies any CP, palpitations, headaches. He does have some vision changes. Not gone to eye doctor and uncontrolled diabetes.   DM- patient has been out of medication for the last couple of weeks. He does need [phone] to not taking Humalog MixChaunMarland [facility]AddMedical [facility]Wilm >privia and dulera.    GERD- omeprazole given to take in am once daily. Refills for 6 months.   Lower leg edema/HTN- lasix 20mg  given for as needed usage. Warned pt he would have to urinate more. Discussed with pt blood pressure could be causing swelling as well. Refilled tribenzor to take daily. Next visit need him to take BP med so can see if controlled or not.   Chronic pain- out  of pain rx. UDS positive for opiates today no other substances. Refilled for one month. Will refer to pain clinic. Trying to get pt in to see if injection would help.

## 2014-03-05 LAB — DRUG SCREEN, URINE
Amphetamine Screen, Ur: NEGATIVE
BARBITURATE QUANT UR: NEGATIVE
Benzodiazepines.: NEGATIVE
COCAINE METABOLITES: NEGATIVE
CREATININE, U: 28.76 mg/dL
MARIJUANA METABOLITE: NEGATIVE
Methadone: NEGATIVE
OPIATES: POSITIVE — AB
Phencyclidine (PCP): NEGATIVE
Propoxyphene: NEGATIVE

## 2014-03-25 ENCOUNTER — Telehealth: Payer: Self-pay | Admitting: *Deleted

## 2014-03-25 NOTE — Telephone Encounter (Signed)
Pt left vm asking for a refill on his hydrocodone.  We gave #90 q6h prn on 7/17.  Is my math off or is this way too soon?  Please advise.

## 2014-03-28 NOTE — Telephone Encounter (Signed)
I have not approved any more than 3 tablets daily. Can refill 8/17.

## 2014-03-28 NOTE — Telephone Encounter (Signed)
Left detailed message on vm notifying pt that we can't fill it until the 8/17.

## 2014-04-04 ENCOUNTER — Telehealth: Payer: Self-pay | Admitting: *Deleted

## 2014-04-04 ENCOUNTER — Other Ambulatory Visit: Payer: Self-pay | Admitting: *Deleted

## 2014-04-04 MED ORDER — HYDROCODONE-ACETAMINOPHEN 10-325 MG PO TABS: 1.0000 | ORAL_TABLET | Freq: Four times a day (QID) | ORAL | Status: DC | PRN

## 2014-04-04 NOTE — Telephone Encounter (Signed)
Pt left vm stating that he can not get his fasting sugars under 200.  He stated that he is doing 40 units of his insulin morning & night.

## 2014-04-04 NOTE — Telephone Encounter (Signed)
Keep increasing by 2 units every 3 days until 50 units morning and night. Come in to discuss changing insulin and perhaps adding mealtime.

## 2014-04-04 NOTE — Telephone Encounter (Signed)
Pt notified of new dosing instructions.

## 2014-05-02 ENCOUNTER — Other Ambulatory Visit: Payer: Self-pay | Admitting: *Deleted

## 2014-05-02 DIAGNOSIS — E1129 Type 2 diabetes mellitus with other diabetic kidney complication: Secondary | ICD-10-CM

## 2014-05-02 DIAGNOSIS — E1165 Type 2 diabetes mellitus with hyperglycemia: Principal | ICD-10-CM

## 2014-05-02 MED ORDER — SITAGLIPTIN PHOS-METFORMIN HCL 50-1000 MG PO TABS: 1.0000 | ORAL_TABLET | Freq: Two times a day (BID) | ORAL | Status: DC

## 2014-05-05 ENCOUNTER — Other Ambulatory Visit: Payer: Self-pay | Admitting: *Deleted

## 2014-05-05 MED ORDER — HYDROCODONE-ACETAMINOPHEN 10-325 MG PO TABS: 1.0000 | ORAL_TABLET | Freq: Four times a day (QID) | ORAL | Status: DC | PRN

## 2014-05-06 ENCOUNTER — Telehealth: Payer: Self-pay | Admitting: *Deleted

## 2014-05-06 ENCOUNTER — Other Ambulatory Visit: Payer: Self-pay | Admitting: Physician Assistant

## 2014-05-06 MED ORDER — METFORMIN HCL 1000 MG PO TABS: 1000.0000 mg | ORAL_TABLET | Freq: Two times a day (BID) | ORAL | Status: DC

## 2014-05-06 MED ORDER — SITAGLIPTIN PHOSPHATE 100 MG PO TABS: 100.0000 mg | ORAL_TABLET | Freq: Every day | ORAL | Status: DC

## 2014-05-06 NOTE — Telephone Encounter (Signed)
Pt is ok with separate meds.

## 2014-05-06 NOTE — Telephone Encounter (Signed)
Pt left vm saying that his ins isn't covering the Janumet anymore and it's over $300.  He's wanting to know if there is anything else we can put him on.  Please advise.

## 2014-05-06 NOTE — Telephone Encounter (Signed)
Can send over separate metformin and januvia to see if they will pay.

## 2014-05-09 ENCOUNTER — Other Ambulatory Visit: Payer: Self-pay | Admitting: Physician Assistant

## 2014-06-03 ENCOUNTER — Ambulatory Visit: Payer: 59 | Admitting: Physician Assistant

## 2014-06-06 ENCOUNTER — Ambulatory Visit (INDEPENDENT_AMBULATORY_CARE_PROVIDER_SITE_OTHER): Payer: 59 | Admitting: Physician Assistant

## 2014-06-06 ENCOUNTER — Encounter: Payer: Self-pay | Admitting: Physician Assistant

## 2014-06-06 VITALS — BP 188/105 | HR 101 | Ht 69.0 in | Wt 194.0 lb

## 2014-06-06 DIAGNOSIS — E118 Type 2 diabetes mellitus with unspecified complications: Secondary | ICD-10-CM

## 2014-06-06 DIAGNOSIS — Z23 Encounter for immunization: Secondary | ICD-10-CM

## 2014-06-06 DIAGNOSIS — J441 Chronic obstructive pulmonary disease with (acute) exacerbation: Secondary | ICD-10-CM

## 2014-06-06 DIAGNOSIS — I10 Essential (primary) hypertension: Secondary | ICD-10-CM

## 2014-06-06 DIAGNOSIS — G894 Chronic pain syndrome: Secondary | ICD-10-CM

## 2014-06-06 DIAGNOSIS — R809 Proteinuria, unspecified: Secondary | ICD-10-CM

## 2014-06-06 LAB — POCT UA - MICROALBUMIN
CREATININE, POC: 50 mg/dL
Microalbumin Ur, POC: 150 mg/L

## 2014-06-06 LAB — POCT GLYCOSYLATED HEMOGLOBIN (HGB A1C): HEMOGLOBIN A1C: 10.2

## 2014-06-06 MED ORDER — HYDROCODONE-ACETAMINOPHEN 10-325 MG PO TABS: 1.0000 | ORAL_TABLET | Freq: Four times a day (QID) | ORAL | Status: DC | PRN

## 2014-06-06 MED ORDER — PRAVASTATIN SODIUM 40 MG PO TABS: ORAL_TABLET | ORAL | Status: DC

## 2014-06-06 MED ORDER — INSULIN GLARGINE 300 UNIT/ML ~~LOC~~ SOPN: 20.0000 [IU] | PEN_INJECTOR | Freq: Every day | SUBCUTANEOUS | Status: DC

## 2014-06-06 MED ORDER — TIOTROPIUM BROMIDE MONOHYDRATE 18 MCG IN CAPS: 18.0000 ug | ORAL_CAPSULE | Freq: Every day | RESPIRATORY_TRACT | Status: AC

## 2014-06-06 MED ORDER — GLIPIZIDE 10 MG PO TABS: 10.0000 mg | ORAL_TABLET | Freq: Two times a day (BID) | ORAL | Status: AC

## 2014-06-06 MED ORDER — OMEPRAZOLE 40 MG PO CPDR: 40.0000 mg | DELAYED_RELEASE_CAPSULE | Freq: Every day | ORAL | Status: AC

## 2014-06-06 MED ORDER — LISINOPRIL-HYDROCHLOROTHIAZIDE 20-25 MG PO TABS: 1.0000 | ORAL_TABLET | Freq: Every day | ORAL | Status: DC

## 2014-06-06 MED ORDER — METFORMIN HCL 1000 MG PO TABS: 1000.0000 mg | ORAL_TABLET | Freq: Two times a day (BID) | ORAL | Status: DC

## 2014-06-06 MED ORDER — DAPAGLIFLOZIN PROPANEDIOL 10 MG PO TABS: 1.0000 | ORAL_TABLET | Freq: Every day | ORAL | Status: DC

## 2014-06-06 NOTE — Progress Notes (Signed)
   Subjective:    Patient ID: Alvin Figueroa, male    DOB: 1966/02/27, 48 y.o.   MRN: [id number]  HPI Pt is here for follow up  DM- not checking sugars. Not insulin, januvia or invokana due to cost. Taking metformin.   HTN- not taking any medications. Does feel dizzy and out of it but denies any CP, palpitations, SOB.   COPD- continues to smoke. Controlled symptoms with inhalers.   Chronic pain- continues to take norco as directed. Denied pain clinic back in feb due to no inusrance. Has insurance now would like to try again.    Review of Systems  All other systems reviewed and are negative.      Objective:   Physical Exam  Constitutional: He is oriented to person, place, and time. He appears well-developed and well-nourished.  HENT:  Head: Normocephalic and atraumatic.  Cardiovascular: Normal rate, regular rhythm and normal heart sounds.   Pulmonary/Chest: Effort normal and breath sounds normal. He has no wheezes.  Neurological: He is alert and oriented to person, place, and time.  Skin: Skin is dry.  Psychiatric: He has a normal mood and affect. His behavior is normal.          Assessment & Plan:  DM type II- .Marland Kitchen. Results for orders placed in visit on 06/06/14  POCT GLYCOSYLATED HEMOGLOBIN (HGB A1C)      Result Value Ref Range   Hemoglobin A1C 10.2    POCT UA - MICROALBUMIN      Result Value Ref Range   Microalbumin Ur, POC 150     Creatinine, POC 50     Albumin/Creatinine Ratio, Urine, POC >300     Discussed importance of taking and staying on medications and communicating if not able to afford.  Foot exam done today.  mircoalbumin with ratio greater than 300 shows macroalbumina. Pt is not taking medications. Uncontrolled DM and HtN. Recheck when a1c is dropping to get a better picture. Ordered cmp today. Pt was placed back on ace today.  Start toujeo 20 units at bedtime. Wait 7 days then increase by 2 units every 5 days until fasting reaches 120 or you reach  45 units. Bring in log of fasting sugars.  Add farxagia. Will check cmp first. Discussed side effects.   Continue on metformin and glipizide.   Pt not been compliant with labs. At next visit. Order lipid again. On pravachol but levels were very abnormal in 2014.   HTN- currently not on anything. Start lisinopril/HCTZ daily. tribenzor is too expensive trying something cheaper.  Follow up in one month.   Chronic pain- refilled norco. Trying to get in pain clinic since he has insurance now.   COPD- well controlled with sprivia, dulera. Needs repeat spironmetry to track progress.    Flu shot given without complication.

## 2014-06-06 NOTE — Patient Instructions (Addendum)
Start toujeo 20 units at bedtime. Wait 7 days then increase by 2 units every 5 days until fasting reaches 120 or you reach 45 units.   Start lisinopril/HCTZ daily.   Need to check kidney function to make sure faraxgia to start.

## 2014-06-09 DIAGNOSIS — R809 Proteinuria, unspecified: Secondary | ICD-10-CM | POA: Insufficient documentation

## 2014-07-04 ENCOUNTER — Encounter: Payer: Self-pay | Admitting: Physician Assistant

## 2014-07-04 ENCOUNTER — Ambulatory Visit (INDEPENDENT_AMBULATORY_CARE_PROVIDER_SITE_OTHER): Payer: 59 | Admitting: Physician Assistant

## 2014-07-04 VITALS — BP 185/110 | HR 96 | Ht 69.0 in | Wt 186.0 lb

## 2014-07-04 DIAGNOSIS — I10 Essential (primary) hypertension: Secondary | ICD-10-CM

## 2014-07-04 DIAGNOSIS — IMO0002 Reserved for concepts with insufficient information to code with codable children: Secondary | ICD-10-CM

## 2014-07-04 DIAGNOSIS — E1129 Type 2 diabetes mellitus with other diabetic kidney complication: Secondary | ICD-10-CM

## 2014-07-04 DIAGNOSIS — E785 Hyperlipidemia, unspecified: Secondary | ICD-10-CM

## 2014-07-04 DIAGNOSIS — J449 Chronic obstructive pulmonary disease, unspecified: Secondary | ICD-10-CM

## 2014-07-04 DIAGNOSIS — E1165 Type 2 diabetes mellitus with hyperglycemia: Secondary | ICD-10-CM

## 2014-07-04 MED ORDER — AZITHROMYCIN 250 MG PO TABS: ORAL_TABLET | ORAL | Status: DC

## 2014-07-04 MED ORDER — PRAVASTATIN SODIUM 40 MG PO TABS: ORAL_TABLET | ORAL | Status: AC

## 2014-07-04 MED ORDER — INSULIN GLULISINE 100 UNIT/ML SOLOSTAR PEN: 4.0000 [IU] | PEN_INJECTOR | Freq: Every day | SUBCUTANEOUS | Status: DC

## 2014-07-04 MED ORDER — METFORMIN HCL 1000 MG PO TABS
1000.0000 mg | ORAL_TABLET | Freq: Two times a day (BID) | ORAL | Status: AC
Start: 2014-07-04 — End: ?

## 2014-07-04 MED ORDER — INSULIN GLULISINE 100 UNIT/ML SOLOSTAR PEN
4.0000 [IU] | PEN_INJECTOR | SUBCUTANEOUS | Status: DC
Start: 2014-07-04 — End: 2014-08-08

## 2014-07-04 MED ORDER — AMLODIPINE BESYLATE 5 MG PO TABS: 5.0000 mg | ORAL_TABLET | Freq: Every day | ORAL | Status: DC

## 2014-07-04 MED ORDER — LISINOPRIL-HYDROCHLOROTHIAZIDE 20-25 MG PO TABS: 1.0000 | ORAL_TABLET | Freq: Every day | ORAL | Status: AC

## 2014-07-04 MED ORDER — HYDROCODONE-ACETAMINOPHEN 10-325 MG PO TABS: 1.0000 | ORAL_TABLET | Freq: Four times a day (QID) | ORAL | Status: DC | PRN

## 2014-07-04 MED ORDER — INSULIN GLARGINE 300 UNIT/ML ~~LOC~~ SOPN: 20.0000 [IU] | PEN_INJECTOR | Freq: Every day | SUBCUTANEOUS | Status: DC

## 2014-07-04 NOTE — Patient Instructions (Signed)
toujeo increase by 3 units every 5 days until fasting blood glucose is 120 or get to 45 units.

## 2014-07-04 NOTE — Progress Notes (Signed)
   Subjective:    Patient ID: Alvin Figueroa, male    DOB: April 10, 1966, 48 y.o.   MRN: [id number]  HPI  Patient is a 48 year old male who presents to the clinic needing medication refills. He once again admits to me that he cannot afford current medications. He has not own any medications for hypertension at this time. He is also not on numerous diabetes medication. The only diabetes medication that he takes is glipizide and he has been using due to J sample only. He stated he took the car to the pharmacy and it was not $15. He is currently not on any cholesterol medication as well. He has been checking his sugars randomly and they are always above 400.  He came in for spironmetry today but has worsening productive cough. Sputum changing to greenish yellow. Feel a little more congested than usual. Continues to smoke. No fever, chills. Does feel SOB.     Review of Systems  All other systems reviewed and are negative.      Objective:   Physical Exam  Constitutional: He is oriented to person, place, and time. He appears well-developed and well-nourished.  HENT:  Head: Normocephalic and atraumatic.  Right Ear: External ear normal.  Left Ear: External ear normal.  Nose: Nose normal.  Mouth/Throat: Oropharynx is clear and moist.  Eyes: Conjunctivae are normal.  Neck: Normal range of motion. Neck supple.  Cardiovascular: Normal rate, regular rhythm and normal heart sounds.   Pulmonary/Chest:  Productive cough. No wheezing.   Neurological: He is alert and oriented to person, place, and time.  Psychiatric: He has a normal mood and affect. His behavior is normal.          Assessment & Plan:  COPD exacerbation with acute bronchitis- zpak given. Refilled inhalers that he requested. Follow up if not improving.   DM-/HTN uncontrolled. clearly ordering new labs is not going to do anything until patient takes medication. Discussed most of his medicinces are on 4 dollar list at [facility].  Regave toujeo card. Discussed importance of medication. If he does not start to take medication then I will have to consider discharging him as patient. I went through each medication and importance of each. If cannot afford then he needs to call and we need to see if there is another option. He does not have insurance but if we can just get him to take insulin right now.   P

## 2014-07-06 DIAGNOSIS — I10 Essential (primary) hypertension: Secondary | ICD-10-CM | POA: Insufficient documentation

## 2014-08-01 ENCOUNTER — Ambulatory Visit: Payer: Self-pay | Admitting: Physician Assistant

## 2014-08-05 ENCOUNTER — Ambulatory Visit: Payer: Self-pay | Admitting: Physician Assistant

## 2014-08-08 ENCOUNTER — Encounter: Payer: Self-pay | Admitting: Physician Assistant

## 2014-08-08 ENCOUNTER — Ambulatory Visit (INDEPENDENT_AMBULATORY_CARE_PROVIDER_SITE_OTHER): Payer: Self-pay | Admitting: Physician Assistant

## 2014-08-08 VITALS — BP 167/93 | HR 92 | Ht <= 58 in | Wt 189.0 lb

## 2014-08-08 DIAGNOSIS — E1129 Type 2 diabetes mellitus with other diabetic kidney complication: Secondary | ICD-10-CM

## 2014-08-08 DIAGNOSIS — I10 Essential (primary) hypertension: Secondary | ICD-10-CM

## 2014-08-08 DIAGNOSIS — G8929 Other chronic pain: Secondary | ICD-10-CM

## 2014-08-08 DIAGNOSIS — E1165 Type 2 diabetes mellitus with hyperglycemia: Secondary | ICD-10-CM

## 2014-08-08 DIAGNOSIS — IMO0002 Reserved for concepts with insufficient information to code with codable children: Secondary | ICD-10-CM

## 2014-08-08 MED ORDER — HYDROCODONE-ACETAMINOPHEN 10-325 MG PO TABS: 1.0000 | ORAL_TABLET | Freq: Four times a day (QID) | ORAL | Status: AC | PRN

## 2014-08-08 MED ORDER — INSULIN GLARGINE 300 UNIT/ML ~~LOC~~ SOPN: 20.0000 [IU] | PEN_INJECTOR | Freq: Every day | SUBCUTANEOUS | Status: AC

## 2014-08-08 MED ORDER — LOSARTAN POTASSIUM 25 MG PO TABS: 25.0000 mg | ORAL_TABLET | Freq: Every day | ORAL | Status: AC

## 2014-08-08 MED ORDER — AMBULATORY NON FORMULARY MEDICATION: Status: AC

## 2014-08-08 MED ORDER — AMLODIPINE BESYLATE 10 MG PO TABS: 10.0000 mg | ORAL_TABLET | Freq: Every day | ORAL | Status: DC

## 2014-08-08 MED ORDER — FUROSEMIDE 20 MG PO TABS: ORAL_TABLET | ORAL | Status: AC

## 2014-08-08 NOTE — Patient Instructions (Signed)
flonase 2 sprays daily.

## 2014-08-09 ENCOUNTER — Encounter: Payer: Self-pay | Admitting: *Deleted

## 2014-08-09 LAB — DRUG SCREEN, URINE
AMPHETAMINE SCRN UR: NEGATIVE
BENZODIAZEPINES.: NEGATIVE
Barbiturate Quant, Ur: NEGATIVE
COCAINE METABOLITES: POSITIVE — AB
Creatinine,U: 18.21 mg/dL
MARIJUANA METABOLITE: NEGATIVE
Methadone: NEGATIVE
OPIATES: POSITIVE — AB
PHENCYCLIDINE (PCP): NEGATIVE
Propoxyphene: NEGATIVE

## 2014-08-09 NOTE — Progress Notes (Signed)
Subjective:    Patient ID: Alvin Figueroa, male    DOB: [birth year], 48 y.o.   MRN: [id number]  HPI   Patient presents to the clinic from medication reconciliation refills today.  Diabetes mellitus-diabetes continues to be uncontrolled. Patient is only taking his oral medications metformin and glipizide. He reports that Tuesday oh was $500. He was given the card but he said the car did not work. His sugars are running in the 400s. He denies any hypoglycemic events. He does feel fatigued, down and shaky all the time.  Hypertension-patient continues to take Norvasc daily. He did stop the lisinopril/HCTZ. He reported that it made him feel very dizzy. He denies any chest pains or palpitations. He counseling has headaches and vision changes. Blood pressure remains uncontrolled today.  Patient was also like a refill on his Norco. He is taking 3 times a day for chronic pain. He is unable to get in with the pain clinic due to not having insurance. He should have insurance per patient the next 3 months. He requests we keep refilling his pain medicine until he can get in with pain clinic.  He does have some right ear pain today. Seems to come and go. Denies any fever, chills, sinus pressure, cough. Denies any ear drainage. Not tried anything to make better.     Review of Systems  All other systems reviewed and are negative.      Objective:   Physical Exam  Constitutional: He is oriented to person, place, and time. He appears well-developed and well-nourished.  HENT:  Head: Normocephalic and atraumatic.  Right Ear: External ear normal.  Left Ear: External ear normal.  TM's clear bilaterally.   Eyes: Right eye exhibits no discharge. Left eye exhibits no discharge.  Neck: Normal range of motion. Neck supple.  Cardiovascular: Normal rate, regular rhythm and normal heart sounds.   Pulmonary/Chest: Effort normal and breath sounds normal.  Lymphadenopathy:    He has no cervical adenopathy.   Neurological: He is alert and oriented to person, place, and time.  Psychiatric: He has a normal mood and affect. His behavior is normal.          Assessment & Plan:  DM, uncontrolled with renal manisfestations- not due to check A1c at this time. Patient is not taking insulin. He reports this is due to cost. He is told me that Toujeo was not $15. Even with card. I'm not sure of believe this. I told him to react with card he was given new prescription and told to call the very next day. Was not able to get toujeo for $15. Once we get him on long acting insulin with metformin and glipizide we can further change therapy. Patient is aware that this is a major issue and that not addressing this diabetes is hurting his overall health.  HTN, uncontrolled-patient's blood pressure continues to not be controlled. He reports that lisinopril is making him dizzy. That was added to his allergy list. Increase Norvasc to 10 mg and added Cozaar 25 mg to see if we can get some better blood pressure control while the Cozaar could also help protect kidneys with diabetes. Follow-up in one month.  Chronic pain- UDS ordered today. Pt is leaving to go out of town and given one month of norco. He is waiting to get insurance to go to pain clinic. I am bridging therapy. 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   Right ear pain- reassured no  signs of infection. Likely eustachian tube dysfunction. Try flonase OTC 2 sprays each nostril daily and zyrtec or claritin.

## 2014-09-09 ENCOUNTER — Ambulatory Visit: Payer: Self-pay | Admitting: Physician Assistant

## 2015-01-24 ENCOUNTER — Other Ambulatory Visit: Payer: Self-pay | Admitting: Physician Assistant

## 2015-06-19 ENCOUNTER — Other Ambulatory Visit: Payer: Self-pay | Admitting: Physician Assistant

## 2015-06-30 ENCOUNTER — Other Ambulatory Visit: Payer: Self-pay | Admitting: Physician Assistant

## 2015-07-26 ENCOUNTER — Other Ambulatory Visit: Payer: Self-pay | Admitting: Physician Assistant

## 2015-07-27 ENCOUNTER — Other Ambulatory Visit: Payer: Self-pay | Admitting: Physician Assistant

## 2015-08-22 ENCOUNTER — Other Ambulatory Visit: Payer: Self-pay

## 2015-10-17 ENCOUNTER — Other Ambulatory Visit: Payer: Self-pay | Admitting: Physician Assistant

## 2015-10-20 ENCOUNTER — Other Ambulatory Visit: Payer: Self-pay | Admitting: Physician Assistant

## 2020-09-19 DEATH — deceased
